# Patient Record
Sex: Female | Born: 1950 | Race: White | Hispanic: No | Smoking: Never smoker
Health system: Southern US, Community
[De-identification: ages and names within clinical notes are randomized; demographics above are authoritative.]

## PROBLEM LIST (undated history)

## (undated) DIAGNOSIS — M1990 Unspecified osteoarthritis, unspecified site: Secondary | ICD-10-CM

## (undated) DIAGNOSIS — K219 Gastro-esophageal reflux disease without esophagitis: Secondary | ICD-10-CM

## (undated) DIAGNOSIS — E785 Hyperlipidemia, unspecified: Secondary | ICD-10-CM

## (undated) DIAGNOSIS — T7840XA Allergy, unspecified, initial encounter: Secondary | ICD-10-CM

## (undated) DIAGNOSIS — F419 Anxiety disorder, unspecified: Secondary | ICD-10-CM

## (undated) DIAGNOSIS — I341 Nonrheumatic mitral (valve) prolapse: Secondary | ICD-10-CM

## (undated) DIAGNOSIS — I10 Essential (primary) hypertension: Secondary | ICD-10-CM

## (undated) DIAGNOSIS — E079 Disorder of thyroid, unspecified: Secondary | ICD-10-CM

## (undated) DIAGNOSIS — M810 Age-related osteoporosis without current pathological fracture: Secondary | ICD-10-CM

## (undated) HISTORY — DX: Allergy, unspecified, initial encounter: T78.40XA

## (undated) HISTORY — DX: Age-related osteoporosis without current pathological fracture: M81.0

## (undated) HISTORY — PX: HERNIA REPAIR: SHX51

## (undated) HISTORY — DX: Gastro-esophageal reflux disease without esophagitis: K21.9

## (undated) HISTORY — DX: Anxiety disorder, unspecified: F41.9

## (undated) HISTORY — DX: Disorder of thyroid, unspecified: E07.9

## (undated) HISTORY — DX: Unspecified osteoarthritis, unspecified site: M19.90

## (undated) HISTORY — PX: KNEE SURGERY: SHX244

## (undated) HISTORY — PX: TONSILLECTOMY: SUR1361

## (undated) HISTORY — PX: ABDOMINAL HYSTERECTOMY: SHX81

## (undated) HISTORY — DX: Hyperlipidemia, unspecified: E78.5

---

## 2003-10-03 ENCOUNTER — Inpatient Hospital Stay (HOSPITAL_COMMUNITY): Admission: AD | Admit: 2003-10-03 | Discharge: 2003-10-03 | Payer: Self-pay

## 2005-01-23 ENCOUNTER — Emergency Department (HOSPITAL_COMMUNITY): Admission: EM | Admit: 2005-01-23 | Discharge: 2005-01-23 | Payer: Self-pay | Admitting: Emergency Medicine

## 2005-03-27 ENCOUNTER — Emergency Department (HOSPITAL_COMMUNITY): Admission: EM | Admit: 2005-03-27 | Discharge: 2005-03-27 | Payer: Self-pay | Admitting: Emergency Medicine

## 2005-08-07 ENCOUNTER — Emergency Department (HOSPITAL_COMMUNITY): Admission: EM | Admit: 2005-08-07 | Discharge: 2005-08-07 | Payer: Self-pay | Admitting: Emergency Medicine

## 2005-10-03 ENCOUNTER — Emergency Department (HOSPITAL_COMMUNITY): Admission: EM | Admit: 2005-10-03 | Discharge: 2005-10-04 | Payer: Self-pay | Admitting: Emergency Medicine

## 2005-10-21 ENCOUNTER — Emergency Department (HOSPITAL_COMMUNITY): Admission: EM | Admit: 2005-10-21 | Discharge: 2005-10-21 | Payer: Self-pay | Admitting: Emergency Medicine

## 2005-11-17 ENCOUNTER — Emergency Department (HOSPITAL_COMMUNITY): Admission: EM | Admit: 2005-11-17 | Discharge: 2005-11-17 | Payer: Self-pay | Admitting: Emergency Medicine

## 2005-12-21 ENCOUNTER — Emergency Department (HOSPITAL_COMMUNITY): Admission: EM | Admit: 2005-12-21 | Discharge: 2005-12-21 | Payer: Self-pay | Admitting: Family Medicine

## 2005-12-23 ENCOUNTER — Emergency Department (HOSPITAL_COMMUNITY): Admission: EM | Admit: 2005-12-23 | Discharge: 2005-12-23 | Payer: Self-pay | Admitting: Emergency Medicine

## 2006-01-19 ENCOUNTER — Emergency Department (HOSPITAL_COMMUNITY): Admission: EM | Admit: 2006-01-19 | Discharge: 2006-01-19 | Payer: Self-pay | Admitting: Emergency Medicine

## 2006-06-30 ENCOUNTER — Emergency Department (HOSPITAL_COMMUNITY): Admission: EM | Admit: 2006-06-30 | Discharge: 2006-06-30 | Payer: Self-pay | Admitting: Emergency Medicine

## 2006-08-26 ENCOUNTER — Emergency Department (HOSPITAL_COMMUNITY): Admission: EM | Admit: 2006-08-26 | Discharge: 2006-08-26 | Payer: Self-pay | Admitting: Emergency Medicine

## 2006-11-13 ENCOUNTER — Emergency Department (HOSPITAL_COMMUNITY): Admission: EM | Admit: 2006-11-13 | Discharge: 2006-11-14 | Payer: Self-pay | Admitting: Emergency Medicine

## 2007-02-05 ENCOUNTER — Emergency Department (HOSPITAL_COMMUNITY): Admission: EM | Admit: 2007-02-05 | Discharge: 2007-02-06 | Payer: Self-pay | Admitting: Emergency Medicine

## 2007-02-17 ENCOUNTER — Emergency Department (HOSPITAL_COMMUNITY): Admission: EM | Admit: 2007-02-17 | Discharge: 2007-02-17 | Payer: Self-pay | Admitting: *Deleted

## 2007-03-28 IMAGING — US US EXTREM LOW VENOUS*L*
1 series · 14 of 23 positions shown · non-contrast
Comparison: none

CLINICAL DATA: Left lower extremity pain and swelling.

LEFT LOWER EXTREMITY VENOUS DUPLEX DOPPLER ULTRASOUND  11/17/2005:
TECHNIQUE: Gray-scale sonography with compression and color and duplex Doppler
ultrasound were performed to evaluate the deep venous system from the level of
the common femoral vein through the popliteal vein.

[Series 1: unknown · 14 of 23 slices shown]
[im 1/23]
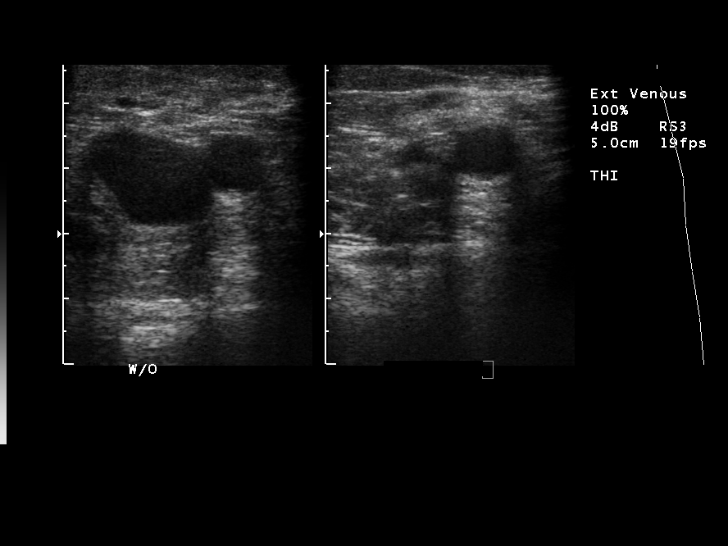
[im 3/23]
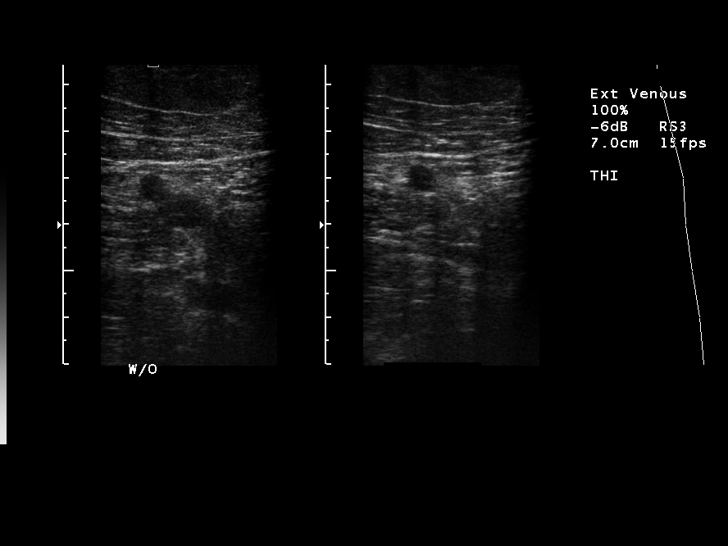
[im 5/23]
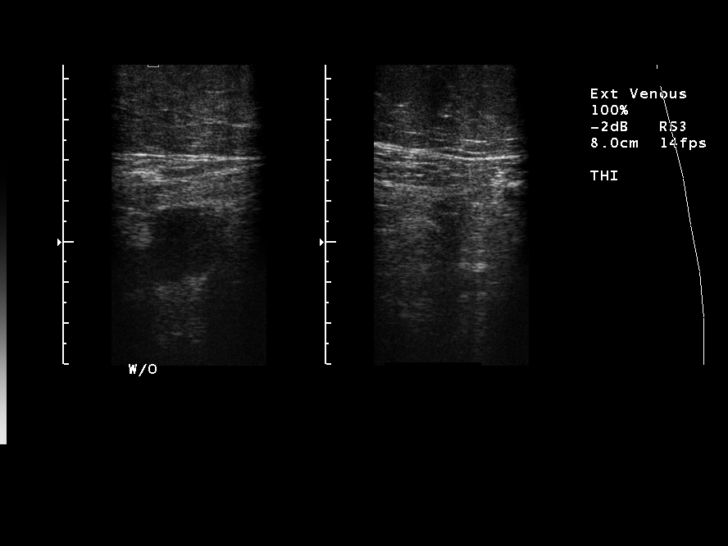
[im 6/23]
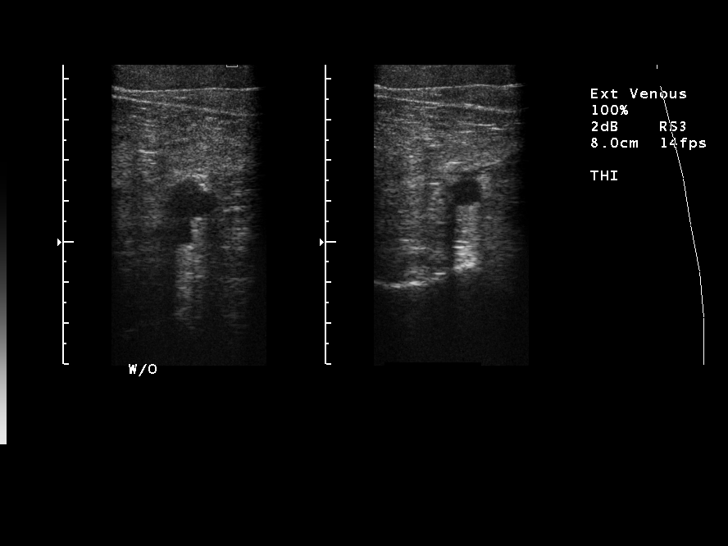
[im 8/23]
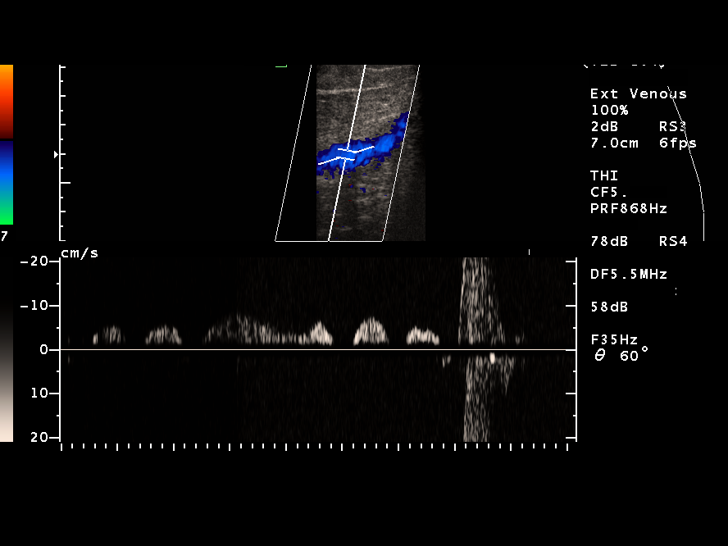
[im 10/23]
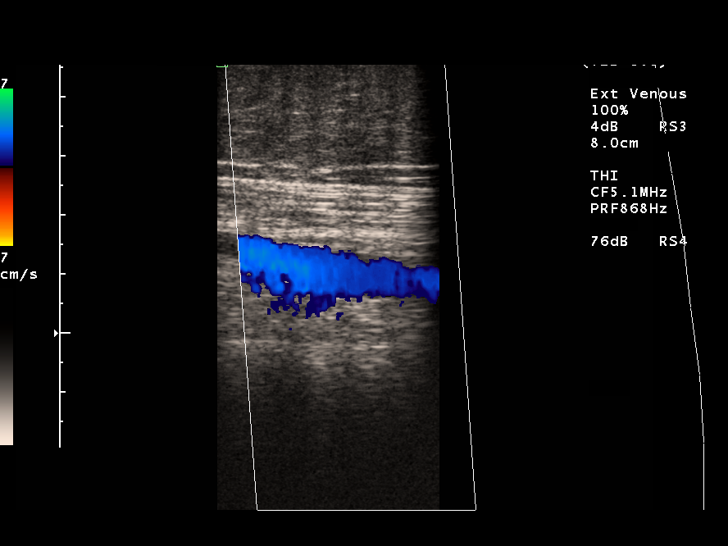
[im 11/23]
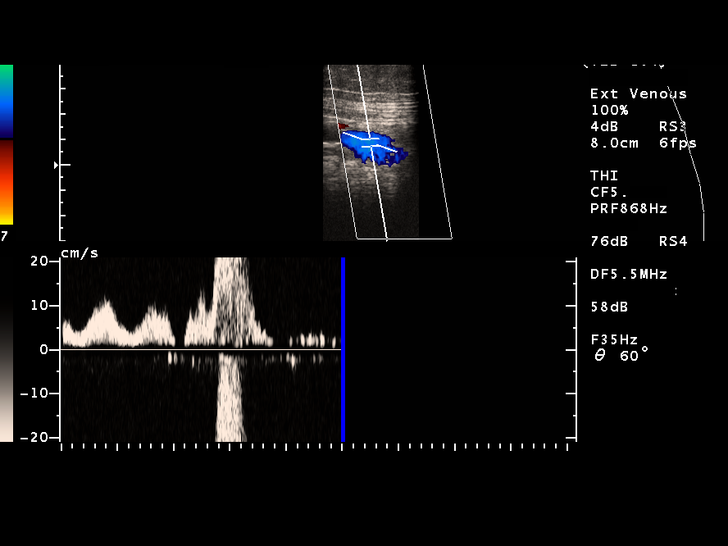
[im 13/23]
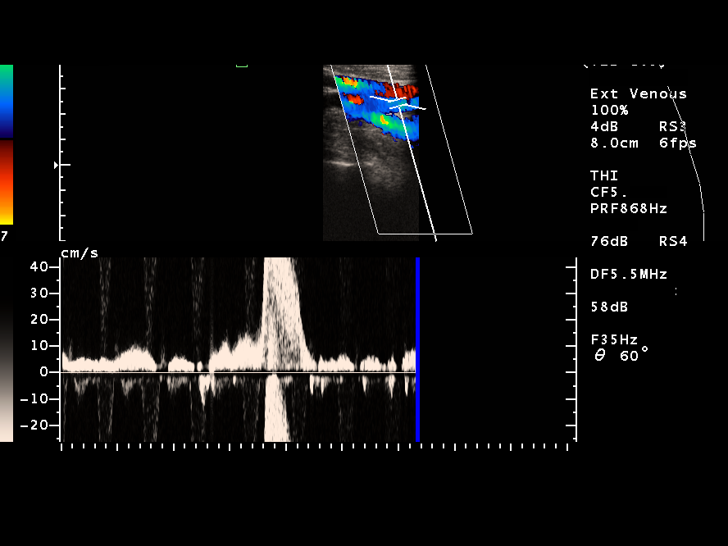
[im 14/23]
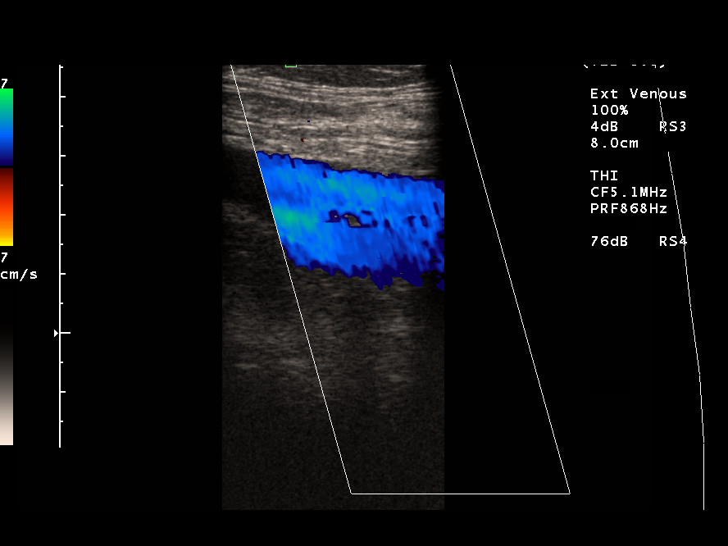
[im 16/23]
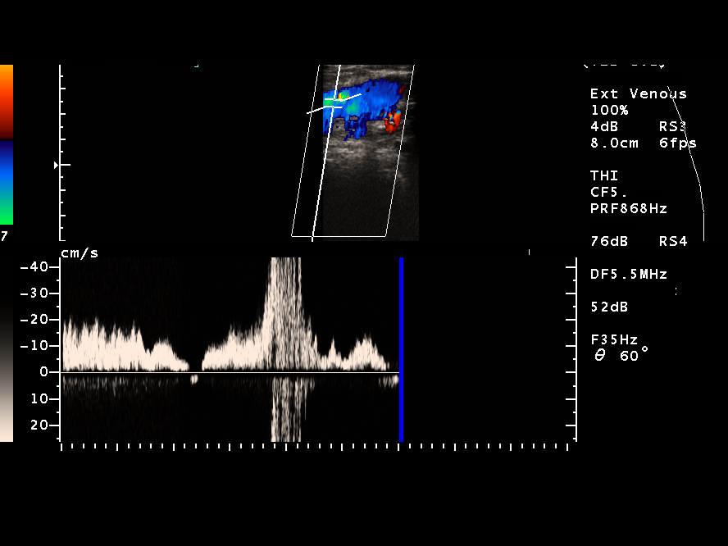
[im 18/23]
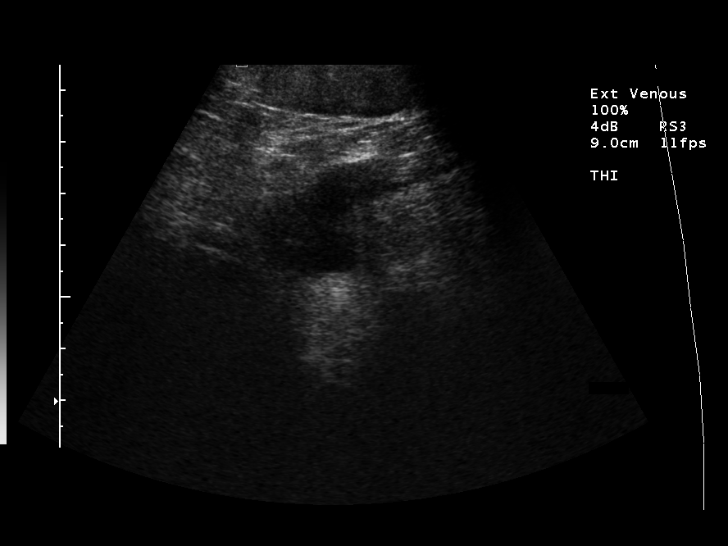
[im 19/23]
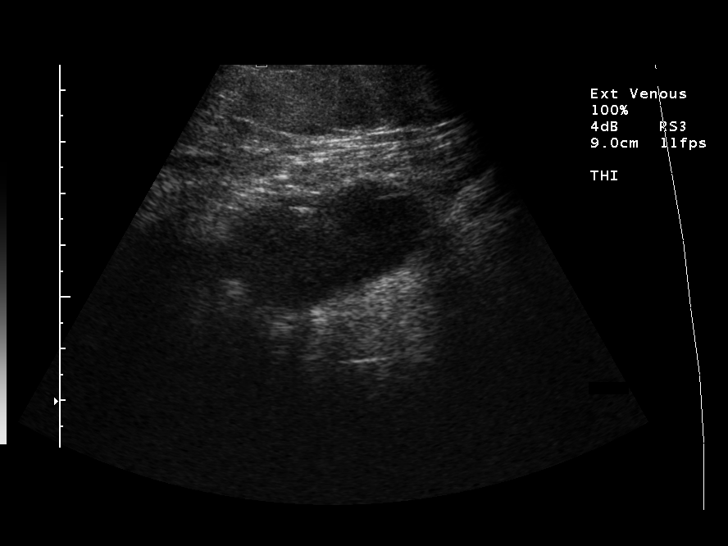
[im 21/23]
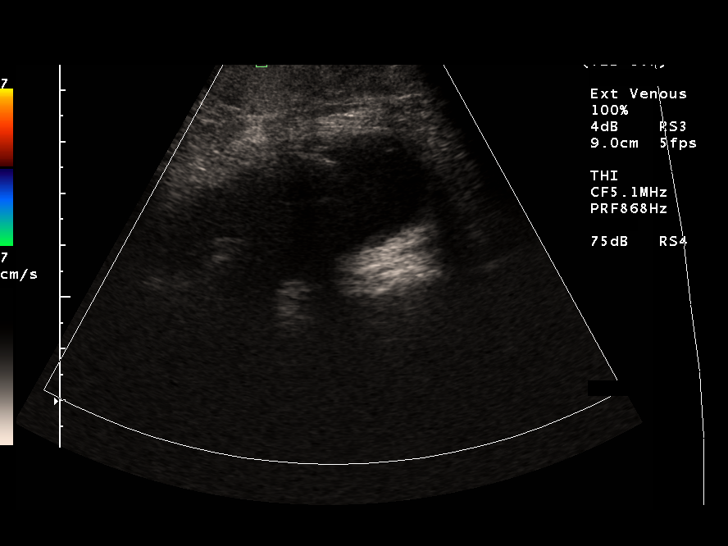
[im 23/23]
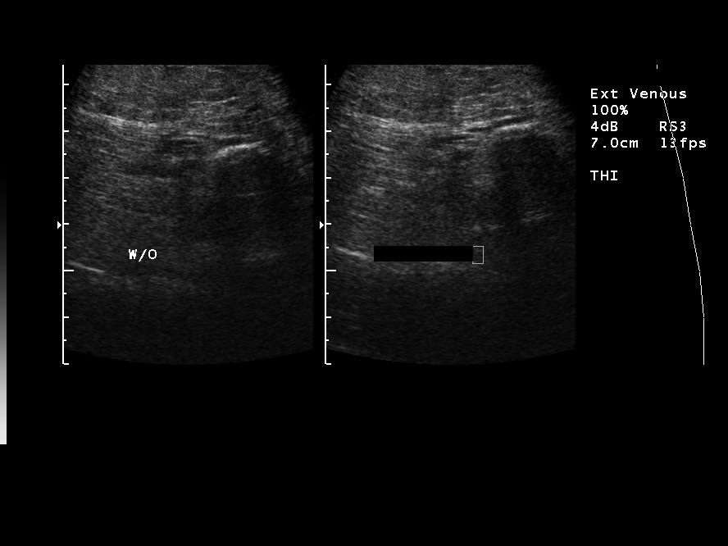

[14 of 23 positions shown; findings below may reference images not displayed]

FINDINGS: The common femoral vein, superficial femoral vein and popliteal vein
all demonstrate normal compressibility. There is normal response to augmentation
in each of these veins. No filling defects are identified to suggest DVT.

Note is made of a fluid collection in the popliteal fossa measuring
approximately 6.2 x 2.2 cm.
IMPRESSION: 1. No evidence of DVT.
2. Baker's cyst in the left popliteal fossa.

## 2007-04-14 ENCOUNTER — Emergency Department (HOSPITAL_COMMUNITY): Admission: EM | Admit: 2007-04-14 | Discharge: 2007-04-14 | Payer: Self-pay | Admitting: Emergency Medicine

## 2007-06-14 ENCOUNTER — Emergency Department (HOSPITAL_COMMUNITY): Admission: EM | Admit: 2007-06-14 | Discharge: 2007-06-14 | Payer: Self-pay | Admitting: Emergency Medicine

## 2007-07-19 ENCOUNTER — Emergency Department (HOSPITAL_COMMUNITY): Admission: EM | Admit: 2007-07-19 | Discharge: 2007-07-19 | Payer: Self-pay | Admitting: Emergency Medicine

## 2007-08-17 ENCOUNTER — Emergency Department (HOSPITAL_COMMUNITY): Admission: EM | Admit: 2007-08-17 | Discharge: 2007-08-17 | Payer: Self-pay | Admitting: Emergency Medicine

## 2007-10-12 ENCOUNTER — Emergency Department (HOSPITAL_COMMUNITY): Admission: EM | Admit: 2007-10-12 | Discharge: 2007-10-13 | Payer: Self-pay | Admitting: Emergency Medicine

## 2007-11-19 ENCOUNTER — Emergency Department (HOSPITAL_COMMUNITY): Admission: EM | Admit: 2007-11-19 | Discharge: 2007-11-19 | Payer: Self-pay | Admitting: Family Medicine

## 2007-12-21 ENCOUNTER — Emergency Department (HOSPITAL_COMMUNITY): Admission: EM | Admit: 2007-12-21 | Discharge: 2007-12-21 | Payer: Self-pay | Admitting: Emergency Medicine

## 2008-01-23 ENCOUNTER — Emergency Department (HOSPITAL_COMMUNITY): Admission: EM | Admit: 2008-01-23 | Discharge: 2008-01-23 | Payer: Self-pay | Admitting: Emergency Medicine

## 2008-02-17 ENCOUNTER — Emergency Department (HOSPITAL_COMMUNITY): Admission: EM | Admit: 2008-02-17 | Discharge: 2008-02-17 | Payer: Self-pay | Admitting: Emergency Medicine

## 2008-04-22 ENCOUNTER — Emergency Department (HOSPITAL_COMMUNITY): Admission: EM | Admit: 2008-04-22 | Discharge: 2008-04-22 | Payer: Self-pay | Admitting: Emergency Medicine

## 2008-06-05 ENCOUNTER — Emergency Department (HOSPITAL_COMMUNITY): Admission: EM | Admit: 2008-06-05 | Discharge: 2008-06-05 | Payer: Self-pay | Admitting: Emergency Medicine

## 2008-07-08 ENCOUNTER — Emergency Department (HOSPITAL_COMMUNITY): Admission: EM | Admit: 2008-07-08 | Discharge: 2008-07-08 | Payer: Self-pay | Admitting: Emergency Medicine

## 2008-08-09 ENCOUNTER — Emergency Department (HOSPITAL_COMMUNITY): Admission: EM | Admit: 2008-08-09 | Discharge: 2008-08-09 | Payer: Self-pay | Admitting: Family Medicine

## 2008-11-22 ENCOUNTER — Emergency Department (HOSPITAL_COMMUNITY): Admission: EM | Admit: 2008-11-22 | Discharge: 2008-11-22 | Payer: Self-pay | Admitting: Family Medicine

## 2009-02-28 ENCOUNTER — Emergency Department (HOSPITAL_COMMUNITY): Admission: EM | Admit: 2009-02-28 | Discharge: 2009-03-01 | Payer: Self-pay | Admitting: Emergency Medicine

## 2009-04-20 ENCOUNTER — Emergency Department (HOSPITAL_COMMUNITY): Admission: EM | Admit: 2009-04-20 | Discharge: 2009-04-21 | Payer: Self-pay | Admitting: Emergency Medicine

## 2009-07-16 ENCOUNTER — Emergency Department (HOSPITAL_COMMUNITY): Admission: EM | Admit: 2009-07-16 | Discharge: 2009-07-16 | Payer: Self-pay | Admitting: Family Medicine

## 2009-07-16 ENCOUNTER — Ambulatory Visit: Payer: Self-pay | Admitting: Cardiology

## 2009-07-16 ENCOUNTER — Observation Stay (HOSPITAL_COMMUNITY): Admission: EM | Admit: 2009-07-16 | Discharge: 2009-07-17 | Payer: Self-pay | Admitting: Emergency Medicine

## 2009-07-21 ENCOUNTER — Telehealth (INDEPENDENT_AMBULATORY_CARE_PROVIDER_SITE_OTHER): Payer: Self-pay

## 2009-07-25 ENCOUNTER — Encounter: Payer: Self-pay | Admitting: Cardiology

## 2009-07-25 ENCOUNTER — Ambulatory Visit: Payer: Self-pay | Admitting: Cardiology

## 2009-07-25 ENCOUNTER — Encounter (HOSPITAL_COMMUNITY): Admission: RE | Admit: 2009-07-25 | Discharge: 2009-10-05 | Payer: Self-pay | Admitting: Cardiology

## 2009-07-25 ENCOUNTER — Ambulatory Visit: Payer: Self-pay

## 2009-08-12 DIAGNOSIS — E119 Type 2 diabetes mellitus without complications: Secondary | ICD-10-CM

## 2009-08-12 DIAGNOSIS — I059 Rheumatic mitral valve disease, unspecified: Secondary | ICD-10-CM | POA: Insufficient documentation

## 2009-08-12 DIAGNOSIS — E039 Hypothyroidism, unspecified: Secondary | ICD-10-CM | POA: Insufficient documentation

## 2009-08-12 DIAGNOSIS — I10 Essential (primary) hypertension: Secondary | ICD-10-CM | POA: Insufficient documentation

## 2009-08-16 ENCOUNTER — Ambulatory Visit: Payer: Self-pay | Admitting: Cardiology

## 2009-08-16 DIAGNOSIS — R42 Dizziness and giddiness: Secondary | ICD-10-CM

## 2009-08-16 DIAGNOSIS — R079 Chest pain, unspecified: Secondary | ICD-10-CM | POA: Insufficient documentation

## 2009-10-06 ENCOUNTER — Emergency Department (HOSPITAL_COMMUNITY): Admission: EM | Admit: 2009-10-06 | Discharge: 2009-10-06 | Payer: Self-pay | Admitting: Emergency Medicine

## 2009-12-10 ENCOUNTER — Emergency Department (HOSPITAL_COMMUNITY): Admission: EM | Admit: 2009-12-10 | Discharge: 2009-12-10 | Payer: Self-pay | Admitting: Emergency Medicine

## 2010-03-01 NOTE — Progress Notes (Signed)
Summary: Nuc. Pre-Procedure  Phone Note Outgoing Call Call back at [facility] Phone [phone]   Call placed by: Irean Hong, RN,  July 21, 2009 3:07 PM Summary of Call: Left message with information on Myoview Information Sheet (see scanned document for details).      Nuclear Med Background Indications for Stress Test: Evaluation for Ischemia, Post Hospital  Indications Comments: Discharged 07/17/09 CP, (-) enzymes.  History: Heart Catheterization, Myocardial Perfusion Study, MVP  History Comments: 4 yrs ago Cath: at [place], Negative per patient. 3 yrs. ago MPS: negative per patient.  Symptoms: Chest Pain, Chest Pain with Exertion    Nuclear Pre-Procedure Cardiac Risk Factors: History of Smoking, Hypertension, IDDM Type 2, Lipids

## 2010-03-01 NOTE — Assessment & Plan Note (Signed)
Summary: Cardiology Nuclear Study  Nuclear Med Background Indications for Stress Test: Evaluation for Ischemia, Post Hospital  Indications Comments: Discharged 07/17/09 CP, (-) enzymes.  History: Heart Catheterization, Myocardial Perfusion Study, MVP  History Comments: 4 yrs ago Cath: at [place], Negative per patient. 3 yrs. ago MPS: negative per patient.  Symptoms: Chest Pain, Chest Pain with Exertion, DOE, SOB    Nuclear Pre-Procedure Cardiac Risk Factors: History of Smoking, Hypertension, IDDM Type 2, Lipids Caffeine/Decaff Intake: None NPO After: 12:00 AM Lungs: clear IV 0.9% NS with Angio Cath: 22g     IV Site: (R) wrist area IV Started by: Stanton Kidney EMT-P Chest Size (in) 38     Cup Size B     Height (in): 68 Weight (lb): 218 BMI: 33.27 Tech Comments: CBG= 184 @ 9:15 am today, per Patient.  Nuclear Med Study 1 or 2 day study:  1 day     Stress Test Type:  Eugenie Birks Reading MD:  Marca Ancona, MD     Referring MD:  J.Hochrein Resting Radionuclide:  Technetium 56m Tetrofosmin     Resting Radionuclide Dose:  11 mCi  Stress Radionuclide:  Technetium 11m Tetrofosmin     Stress Radionuclide Dose:  33 mCi   Stress Protocol   Lexiscan: 0.4 mg   Stress Test Technologist:  Milana Na EMT-P     Nuclear Technologist:  Domenic Polite CNMT  Rest Procedure  Myocardial perfusion imaging was performed at rest 45 minutes following the intravenous administration of Myoview Technetium 91m Tetrofosmin.  Stress Procedure  The patient received IV Lexiscan 0.4 mg over 15-seconds.  Myoview injected at 30-seconds.  There were no significant changes with infusion.  Quantitative spect images were obtained after a 45 minute delay.  QPS Raw Data Images:  Normal; no motion artifact; normal heart/lung ratio. Stress Images:  NI: Uniform and normal uptake of tracer in all myocardial segments. Rest Images:  Normal homogeneous uptake in all areas of the myocardium. Subtraction (SDS):  There is  no evidence of scar or ischemia. Transient Ischemic Dilatation:  1.01  (Normal <1.22)  Lung/Heart Ratio:  .29  (Normal <0.45)  Quantitative Gated Spect Images QGS EDV:  79 ml QGS ESV:  26 ml QGS EF:  67 % QGS cine images:  Normal wall motion   Overall Impression  Exercise Capacity: Lexiscan study with no exercise.  BP Response: Hypotensive blood pressure response. Clinical Symptoms: Palpitations ECG Impression: No significant ST segment change suggestive of ischemia. Overall Impression: Normal stress nuclear study.  Appended Document: Cardiology Nuclear Study No evidence of ischemia or infarct.  No further cardiac work up.  Appended Document: Cardiology Nuclear Study Called patient and left message on machine of results and to call back with any questions

## 2010-03-01 NOTE — Assessment & Plan Note (Signed)
Summary: eph/post myoview/lg   Visit Type:  Follow-up Primary Provider:  [facility]  CC:  chest pain.  History of Present Illness: The patient presents for evaluation of chest discomfort. She was hospitalized late last month. She ruled out for microinfarction. Stress perfusion study demonstrated no evidence of ischemia or infarct. Since that time she has done well with no further chest discomfort. She has had some dizziness and reports that her blood pressure has been running low. She has not been describing palpitations, presyncope or syncope. She has had no new shortness of breath, PND or orthopnea. She is under stress because her mother was just diagnosed with heart failure.  Current Medications (verified): 1)  Verapamil Hcl Cr 180 Mg Xr24h-Cap (Verapamil Hcl) .... Take One Capsule By Two Times A Day 2)  Synthroid 75 Mcg Tabs (Levothyroxine Sodium) .... Take 1 Tablet By Mouth Once A Day 3)  Hydrochlorothiazide 25 Mg Tabs (Hydrochlorothiazide) .... Take One Tablet By Mouth Daily. 4)  Vitamin D3 1000 Unit Tabs (Cholecalciferol) .... Take 1 Tablet By Mouth Once A Day 5)  Metformin Hcl 1000 Mg Tabs (Metformin Hcl) .... Take 1 Tablet By Mouth Two Times A Day 6)  Novolin 70/30 70-30 % Susp (Insulin Isophane & Regular) .... 42 Units Am and 22 Units Pm 7)  Crestor 20 Mg Tabs (Rosuvastatin Calcium) .... Take 1/2  Tablet By Mouth Daily. 8)  Nitrostat 0.4 Mg Subl (Nitroglycerin) .Marland Kitchen.. 1 Tablet Under Tongue At Onset of Chest Pain; You May Repeat Every 5 Minutes For Up To 3 Doses. 9)  Aspirin Ec 325 Mg Tbec (Aspirin) .... Take One Tablet By Mouth Daily 10)  Fish Oil 1000 Mg Caps (Omega-3 Fatty Acids) .... 2 Capsules Two Times A Day 11)  Hydrocodone-Acetaminophen 5-500 Mg Tabs (Hydrocodone-Acetaminophen) .... As Needed  Allergies: 1)  ! Thorazine 2)  ! Compazine 3)  ! * Vistaril 4)  ! Darvocet 5)  ! Percocet 6)  ! Codeine  Past History:  Past Medical History: Reviewed history from 08/12/2009  and no changes required.  Diabetes mellitus x6 years, hypertension x4   years, panic attacks, anxiety, mitral valve prolapse, hypothyroidism,   hepatitis (she cannot give me any details on this but says she did not   have any antibodies when last tested), seizures.      Past Surgical History: Reviewed history from 08/12/2009 and no changes required.  Left arm surgery after falling down the stairs   with a seizure, hysterectomy, cholecystectomy, benign skin lesion   resected.   Review of Systems       As stated in the HPI and negative for all other systems.   Vital Signs:  Patient profile:   60 year old female Height:      68 inches Weight:      221.50 pounds BMI:     33.80 Pulse rate:   88 / minute Pulse (ortho):   93 / minute Pulse rhythm:   regular Resp:     18 per minute BP sitting:   124 / 70  (right arm) BP standing:   126 / 77 Cuff size:   large  Vitals Entered By: Vikki Ports (August 16, 2009 2:50 PM)  Serial Vital Signs/Assessments:  Time      Position  BP       Pulse  Resp  Temp     By           Lying RA  123/80   81  Vikki Ports           Sitting   119/78   83                    Luz Jaramillo 4:15 PM   Standing  126/77   93                    Luz Jaramillo 4:17 PM   Standing  125/84   94                    Luz Jaramillo 4:20 PM   Standing  130/81   93                    Vikki Ports   Physical Exam  General:  Well developed, well nourished, in no acute distress. Head:  normocephalic and atraumatic Eyes:  PERRLA/EOM intact; conjunctiva and lids normal. Mouth:  Edentulous. Oral mucosa normal. Neck:  Neck supple, no JVD. No masses, thyromegaly or abnormal cervical nodes. Chest Wall:  no deformities or breast masses noted Lungs:  Clear bilaterally to auscultation and percussion. Abdomen:  Bowel sounds positive; abdomen soft and non-tender without masses, organomegaly, or hernias noted. No hepatosplenomegaly. Msk:  Back normal, normal  gait. Muscle strength and tone normal. Extremities:  No clubbing or cyanosis. Neurologic:  Alert and oriented x 3. Skin:  Intact without lesions or rashes. Psych:  Normal affect.   Detailed Cardiovascular Exam  Neck    Carotids: Carotids full and equal bilaterally without bruits.      Neck Veins: Normal, no JVD.    Heart    Inspection: no deformities or lifts noted.      Palpation: normal PMI with no thrills palpable.      Auscultation: regular rate and rhythm, S1, S2 without murmurs, rubs, gallops, or clicks.    Vascular    Abdominal Aorta: no palpable masses, pulsations, or audible bruits.      Femoral Pulses: normal femoral pulses bilaterally.      Pedal Pulses: normal pedal pulses bilaterally.      Radial Pulses: normal radial pulses bilaterally.      Peripheral Circulation: no clubbing, cyanosis, or edema noted with normal capillary refill.     Impression & Recommendations:  Problem # 1:  CHEST PAIN (ICD-786.50) The etiology of  her previous chest pain was not clear. There does not appear to be an anginal equivalent. No further cardiovascular testing is suggested.  Problem # 2:  HYPERTENSION (ICD-401.9) She reports some recent low blood pressures though it is normal today. She was not orthostatic. If her pressures continue to be low she needs to correlate her blood pressure cuff with her primary physician and can have her meds adjusted downward.  Problem # 3:  MITRAL VALVE PROLAPSE (ICD-424.0) She has no murmur or regurgitation. No further imaging is indicated at this point.  Patient Instructions: 1)  Your physician recommends that you schedule a follow-up appointment as needed 2)  Your physician recommends that you continue on your current medications as directed. Please refer to the Current Medication list given to you today. Prescriptions: VERAPAMIL HCL CR 180 MG XR24H-CAP (VERAPAMIL HCL) Take one capsule by two times a day  #20 x 0   Entered by:   Charolotte Capuchin, RN   Authorized by:   Rollene Rotunda, MD, [facility]   Signed by:   Charolotte Capuchin, RN on 08/16/2009   Method used:   Electronically  to        [facility] [phone]* (retail)       [address]       [place], [place]  [address]       Ph: [phone]       Fax: [phone]   RxID:   [id number]  I have reviewed and approved all prescriptions at the time of this visit. Rollene Rotunda, MD, [facility]  August 17, 2009 5:43 PM

## 2010-03-03 ENCOUNTER — Emergency Department (HOSPITAL_COMMUNITY)
Admission: EM | Admit: 2010-03-03 | Discharge: 2010-03-04 | Disposition: A | Discharge status: Home / Self Care | Payer: Medicare Other | Attending: Emergency Medicine | Admitting: Emergency Medicine

## 2010-03-03 DIAGNOSIS — E669 Obesity, unspecified: Secondary | ICD-10-CM | POA: Insufficient documentation

## 2010-03-03 DIAGNOSIS — M25559 Pain in unspecified hip: Secondary | ICD-10-CM | POA: Insufficient documentation

## 2010-03-03 DIAGNOSIS — I10 Essential (primary) hypertension: Secondary | ICD-10-CM | POA: Insufficient documentation

## 2010-03-03 DIAGNOSIS — E039 Hypothyroidism, unspecified: Secondary | ICD-10-CM | POA: Insufficient documentation

## 2010-03-03 DIAGNOSIS — E119 Type 2 diabetes mellitus without complications: Secondary | ICD-10-CM | POA: Insufficient documentation

## 2010-03-03 DIAGNOSIS — I059 Rheumatic mitral valve disease, unspecified: Secondary | ICD-10-CM | POA: Insufficient documentation

## 2010-03-03 DIAGNOSIS — M545 Low back pain, unspecified: Secondary | ICD-10-CM | POA: Insufficient documentation

## 2010-03-03 DIAGNOSIS — E7800 Pure hypercholesterolemia, unspecified: Secondary | ICD-10-CM | POA: Insufficient documentation

## 2010-03-04 ENCOUNTER — Emergency Department (HOSPITAL_COMMUNITY): Payer: Medicare Other

## 2010-03-04 ENCOUNTER — Emergency Department (HOSPITAL_COMMUNITY): Admit: 2010-03-04 | Payer: Medicare Other

## 2010-03-18 ENCOUNTER — Emergency Department (HOSPITAL_COMMUNITY): Payer: Medicare Other

## 2010-03-18 ENCOUNTER — Emergency Department (HOSPITAL_COMMUNITY)
Admission: EM | Admit: 2010-03-18 | Discharge: 2010-03-18 | Disposition: A | Discharge status: Home / Self Care | Payer: Medicare Other | Attending: Emergency Medicine | Admitting: Emergency Medicine

## 2010-03-18 DIAGNOSIS — J3489 Other specified disorders of nose and nasal sinuses: Secondary | ICD-10-CM | POA: Insufficient documentation

## 2010-03-18 DIAGNOSIS — R350 Frequency of micturition: Secondary | ICD-10-CM | POA: Insufficient documentation

## 2010-03-18 DIAGNOSIS — E7800 Pure hypercholesterolemia, unspecified: Secondary | ICD-10-CM | POA: Insufficient documentation

## 2010-03-18 DIAGNOSIS — R3915 Urgency of urination: Secondary | ICD-10-CM | POA: Insufficient documentation

## 2010-03-18 DIAGNOSIS — E119 Type 2 diabetes mellitus without complications: Secondary | ICD-10-CM | POA: Insufficient documentation

## 2010-03-18 DIAGNOSIS — R300 Dysuria: Secondary | ICD-10-CM | POA: Insufficient documentation

## 2010-03-18 DIAGNOSIS — R197 Diarrhea, unspecified: Secondary | ICD-10-CM | POA: Insufficient documentation

## 2010-03-18 DIAGNOSIS — I10 Essential (primary) hypertension: Secondary | ICD-10-CM | POA: Insufficient documentation

## 2010-03-18 DIAGNOSIS — Z794 Long term (current) use of insulin: Secondary | ICD-10-CM | POA: Insufficient documentation

## 2010-03-18 DIAGNOSIS — I059 Rheumatic mitral valve disease, unspecified: Secondary | ICD-10-CM | POA: Insufficient documentation

## 2010-03-18 DIAGNOSIS — E669 Obesity, unspecified: Secondary | ICD-10-CM | POA: Insufficient documentation

## 2010-03-18 DIAGNOSIS — R509 Fever, unspecified: Secondary | ICD-10-CM | POA: Insufficient documentation

## 2010-03-18 DIAGNOSIS — N390 Urinary tract infection, site not specified: Secondary | ICD-10-CM | POA: Insufficient documentation

## 2010-03-18 DIAGNOSIS — R1110 Vomiting, unspecified: Secondary | ICD-10-CM | POA: Insufficient documentation

## 2010-03-18 DIAGNOSIS — R05 Cough: Secondary | ICD-10-CM | POA: Insufficient documentation

## 2010-03-18 DIAGNOSIS — J069 Acute upper respiratory infection, unspecified: Secondary | ICD-10-CM | POA: Insufficient documentation

## 2010-03-18 DIAGNOSIS — E039 Hypothyroidism, unspecified: Secondary | ICD-10-CM | POA: Insufficient documentation

## 2010-03-18 DIAGNOSIS — R319 Hematuria, unspecified: Secondary | ICD-10-CM | POA: Insufficient documentation

## 2010-03-18 DIAGNOSIS — R059 Cough, unspecified: Secondary | ICD-10-CM | POA: Insufficient documentation

## 2010-03-18 LAB — GLUCOSE, CAPILLARY

## 2010-03-18 LAB — URINALYSIS, ROUTINE W REFLEX MICROSCOPIC
Bilirubin Urine: NEGATIVE
Protein, ur: >300 mg/dL — AB
Urobilinogen, UA: 0.2 mg/dL (ref 0.0–1.0)

## 2010-04-13 LAB — POCT CARDIAC MARKERS
CKMB, poc: 1.2 ng/mL (ref 1.0–8.0)
Myoglobin, poc: 57.9 ng/mL (ref 12–200)
Troponin i, poc: <0.05 ng/mL (ref 0.00–0.09)

## 2010-04-16 LAB — URINALYSIS, ROUTINE W REFLEX MICROSCOPIC
Bilirubin Urine: NEGATIVE
Ketones, ur: NEGATIVE mg/dL
Leukocytes, UA: NEGATIVE
Nitrite: NEGATIVE
Protein, ur: NEGATIVE mg/dL
Urobilinogen, UA: 0.2 mg/dL (ref 0.0–1.0)
pH: 5 (ref 5.0–8.0)

## 2010-04-16 LAB — DIFFERENTIAL
Basophils Absolute: 0 10*3/uL (ref 0.0–0.1)
Basophils Relative: 0 % (ref 0–1)
Basophils Relative: 1 % (ref 0–1)
Eosinophils Absolute: 0 10*3/uL (ref 0.0–0.7)
Eosinophils Absolute: 0.1 10*3/uL (ref 0.0–0.7)
Eosinophils Relative: 0 % (ref 0–5)
Eosinophils Relative: 2 % (ref 0–5)
Lymphs Abs: 1.6 10*3/uL (ref 0.7–4.0)
Monocytes Absolute: 0.8 10*3/uL (ref 0.1–1.0)
Neutrophils Relative %: 58 % (ref 43–77)

## 2010-04-16 LAB — CK TOTAL AND CKMB (NOT AT ARMC)
CK, MB: 1.9 ng/mL (ref 0.3–4.0)
Relative Index: 0.4 (ref 0.0–2.5)
Total CK: 499 U/L — ABNORMAL HIGH (ref 7–177)

## 2010-04-16 LAB — CBC
HCT: 37.9 % (ref 36.0–46.0)
HCT: 37.4 % (ref 36.0–46.0)
Hemoglobin: 12.5 g/dL (ref 12.0–15.0)
MCHC: 34.1 g/dL (ref 30.0–36.0)
MCHC: 34.7 g/dL (ref 30.0–36.0)
MCHC: 34.9 g/dL (ref 30.0–36.0)
MCV: 90.6 fL (ref 78.0–100.0)
MCV: 90.7 fL (ref 78.0–100.0)
Platelets: 171 10*3/uL (ref 150–400)
Platelets: 191 10*3/uL (ref 150–400)
RDW: 13.1 % (ref 11.5–15.5)
WBC: 8 10*3/uL (ref 4.0–10.5)

## 2010-04-16 LAB — BASIC METABOLIC PANEL
BUN: 12 mg/dL (ref 6–23)
CO2: 27 mEq/L (ref 19–32)
Calcium: 8.3 mg/dL — ABNORMAL LOW (ref 8.4–10.5)
Calcium: 9.3 mg/dL (ref 8.4–10.5)
Chloride: 102 mEq/L (ref 96–112)
Creatinine, Ser: 0.62 mg/dL (ref 0.4–1.2)
Creatinine, Ser: 0.65 mg/dL (ref 0.4–1.2)
GFR calc Af Amer: >60 mL/min (ref >60)
GFR calc Af Amer: >60 mL/min (ref >60)
GFR calc non Af Amer: >60 mL/min (ref >60)
GFR calc non Af Amer: >60 mL/min (ref >60)
Glucose, Bld: 103 mg/dL — ABNORMAL HIGH (ref 70–99)
Sodium: 137 mEq/L (ref 135–145)

## 2010-04-16 LAB — HEMOGLOBIN A1C
Hgb A1c MFr Bld: 7.6 % — ABNORMAL HIGH (ref <5.7)
Mean Plasma Glucose: 171 mg/dL — ABNORMAL HIGH (ref <117)

## 2010-04-16 LAB — POCT I-STAT, CHEM 8
Creatinine, Ser: 0.6 mg/dL (ref 0.4–1.2)
Glucose, Bld: 130 mg/dL — ABNORMAL HIGH (ref 70–99)
HCT: 38 % (ref 36.0–46.0)
Hemoglobin: 12.9 g/dL (ref 12.0–15.0)
Potassium: 3.8 mEq/L (ref 3.5–5.1)
Sodium: 135 mEq/L (ref 135–145)
TCO2: 25 mmol/L (ref 0–100)

## 2010-04-16 LAB — GLUCOSE, CAPILLARY
Glucose-Capillary: 190 mg/dL — ABNORMAL HIGH (ref 70–99)
Glucose-Capillary: 194 mg/dL — ABNORMAL HIGH (ref 70–99)

## 2010-04-16 LAB — CARDIAC PANEL(CRET KIN+CKTOT+MB+TROPI)
Relative Index: 0.4 (ref 0.0–2.5)
Troponin I: 0.01 ng/mL (ref 0.00–0.06)

## 2010-04-16 LAB — LIPID PANEL
HDL: 37 mg/dL — ABNORMAL LOW (ref >39)
Total CHOL/HDL Ratio: 3.5 RATIO

## 2010-04-16 LAB — TSH: TSH: 0.729 u[IU]/mL (ref 0.350–4.500)

## 2010-04-16 LAB — TROPONIN I: Troponin I: 0.01 ng/mL (ref 0.00–0.06)

## 2010-05-07 LAB — POCT I-STAT, CHEM 8
Calcium, Ion: 1.22 mmol/L (ref 1.12–1.32)
Glucose, Bld: 125 mg/dL — ABNORMAL HIGH (ref 70–99)
HCT: 42 % (ref 36.0–46.0)
Hemoglobin: 14.3 g/dL (ref 12.0–15.0)
TCO2: 28 mmol/L (ref 0–100)

## 2010-05-07 LAB — DIFFERENTIAL
Basophils Absolute: 0.1 10*3/uL (ref 0.0–0.1)
Basophils Relative: 1 % (ref 0–1)
Neutro Abs: 4.4 10*3/uL (ref 1.7–7.7)
Neutrophils Relative %: 52 % (ref 43–77)

## 2010-05-07 LAB — CBC
MCHC: 35 g/dL (ref 30.0–36.0)
RBC: 4.42 MIL/uL (ref 3.87–5.11)
RDW: 13.1 % (ref 11.5–15.5)

## 2010-05-11 LAB — DIFFERENTIAL
Eosinophils Absolute: 0.1 10*3/uL (ref 0.0–0.7)
Lymphs Abs: 1.8 10*3/uL (ref 0.7–4.0)
Neutrophils Relative %: 62 % (ref 43–77)

## 2010-05-11 LAB — CBC
MCV: 91.5 fL (ref 78.0–100.0)
Platelets: 181 10*3/uL (ref 150–400)
WBC: 7.6 10*3/uL (ref 4.0–10.5)

## 2010-05-11 LAB — BASIC METABOLIC PANEL
BUN: 12 mg/dL (ref 6–23)
Chloride: 100 mEq/L (ref 96–112)
Creatinine, Ser: 1.2 mg/dL (ref 0.4–1.2)
GFR calc non Af Amer: 46 mL/min — ABNORMAL LOW (ref >60)

## 2010-05-15 LAB — POCT CARDIAC MARKERS
CKMB, poc: <1 ng/mL — ABNORMAL LOW (ref 1.0–8.0)
Troponin i, poc: <0.05 ng/mL (ref 0.00–0.09)

## 2010-05-15 LAB — POCT I-STAT, CHEM 8
BUN: 17 mg/dL (ref 6–23)
Chloride: 103 mEq/L (ref 96–112)
Creatinine, Ser: 0.8 mg/dL (ref 0.4–1.2)
Glucose, Bld: 88 mg/dL (ref 70–99)
Potassium: 3.9 mEq/L (ref 3.5–5.1)

## 2010-05-15 LAB — CBC
MCV: 90.5 fL (ref 78.0–100.0)
RBC: 4.67 MIL/uL (ref 3.87–5.11)
WBC: 10.2 10*3/uL (ref 4.0–10.5)

## 2010-05-15 LAB — DIFFERENTIAL
Eosinophils Absolute: 0.1 10*3/uL (ref 0.0–0.7)
Lymphocytes Relative: 35 % (ref 12–46)
Lymphs Abs: 3.6 10*3/uL (ref 0.7–4.0)
Monocytes Relative: 9 % (ref 3–12)
Neutro Abs: 5.5 10*3/uL (ref 1.7–7.7)
Neutrophils Relative %: 54 % (ref 43–77)

## 2010-05-24 ENCOUNTER — Ambulatory Visit (INDEPENDENT_AMBULATORY_CARE_PROVIDER_SITE_OTHER): Payer: Medicare Other

## 2010-05-24 ENCOUNTER — Inpatient Hospital Stay (INDEPENDENT_AMBULATORY_CARE_PROVIDER_SITE_OTHER)
Admission: RE | Admit: 2010-05-24 | Discharge: 2010-05-24 | Disposition: A | Discharge status: Home / Self Care | Payer: Medicare Other | Source: Ambulatory Visit | Attending: Family Medicine | Admitting: Family Medicine

## 2010-05-24 DIAGNOSIS — M79609 Pain in unspecified limb: Secondary | ICD-10-CM

## 2010-06-20 ENCOUNTER — Emergency Department (HOSPITAL_COMMUNITY)
Admission: EM | Admit: 2010-06-20 | Discharge: 2010-06-20 | Disposition: A | Discharge status: Home / Self Care | Payer: Medicare Other | Attending: Emergency Medicine | Admitting: Emergency Medicine

## 2010-06-20 DIAGNOSIS — E669 Obesity, unspecified: Secondary | ICD-10-CM | POA: Insufficient documentation

## 2010-06-20 DIAGNOSIS — M7989 Other specified soft tissue disorders: Secondary | ICD-10-CM | POA: Insufficient documentation

## 2010-06-20 DIAGNOSIS — M25569 Pain in unspecified knee: Secondary | ICD-10-CM | POA: Insufficient documentation

## 2010-06-20 DIAGNOSIS — Z794 Long term (current) use of insulin: Secondary | ICD-10-CM | POA: Insufficient documentation

## 2010-06-20 DIAGNOSIS — I059 Rheumatic mitral valve disease, unspecified: Secondary | ICD-10-CM | POA: Insufficient documentation

## 2010-06-20 DIAGNOSIS — E039 Hypothyroidism, unspecified: Secondary | ICD-10-CM | POA: Insufficient documentation

## 2010-06-20 DIAGNOSIS — E7800 Pure hypercholesterolemia, unspecified: Secondary | ICD-10-CM | POA: Insufficient documentation

## 2010-06-20 DIAGNOSIS — E119 Type 2 diabetes mellitus without complications: Secondary | ICD-10-CM | POA: Insufficient documentation

## 2010-06-20 DIAGNOSIS — I10 Essential (primary) hypertension: Secondary | ICD-10-CM | POA: Insufficient documentation

## 2010-10-18 ENCOUNTER — Inpatient Hospital Stay (INDEPENDENT_AMBULATORY_CARE_PROVIDER_SITE_OTHER)
Admission: RE | Admit: 2010-10-18 | Discharge: 2010-10-18 | Disposition: A | Discharge status: Home / Self Care | Payer: Medicare Other | Source: Ambulatory Visit | Attending: Family Medicine | Admitting: Family Medicine

## 2010-10-18 DIAGNOSIS — M799 Soft tissue disorder, unspecified: Secondary | ICD-10-CM

## 2010-10-18 LAB — POCT URINALYSIS DIP (DEVICE)
Bilirubin Urine: NEGATIVE
Ketones, ur: NEGATIVE mg/dL
Nitrite: NEGATIVE

## 2010-10-19 LAB — URINALYSIS, ROUTINE W REFLEX MICROSCOPIC
Ketones, ur: NEGATIVE
Nitrite: NEGATIVE
Specific Gravity, Urine: 1.008
Urobilinogen, UA: 0.2
pH: 5.5

## 2010-11-01 LAB — BASIC METABOLIC PANEL
BUN: 18
Calcium: 9
GFR calc non Af Amer: >60
Potassium: 4.1
Sodium: 137

## 2010-11-01 LAB — CBC
HCT: 36.4
Platelets: 228
WBC: 8.3

## 2010-11-01 LAB — CK TOTAL AND CKMB (NOT AT ARMC)
CK, MB: 0.8
CK, MB: 0.9
Relative Index: INVALID
Total CK: 45
Total CK: 52

## 2010-11-01 LAB — POCT I-STAT, CHEM 8
BUN: 20
Calcium, Ion: 1.15
Chloride: 105
Creatinine, Ser: 0.7
Glucose, Bld: 128 — ABNORMAL HIGH
HCT: 37
Potassium: 4

## 2010-11-01 LAB — APTT: aPTT: 27

## 2010-11-01 LAB — TROPONIN I: Troponin I: <0.01

## 2010-11-01 LAB — MAGNESIUM: Magnesium: 1.9

## 2010-11-01 LAB — POCT CARDIAC MARKERS: Troponin i, poc: <0.05

## 2010-11-03 LAB — RAPID STREP SCREEN (MED CTR MEBANE ONLY): Streptococcus, Group A Screen (Direct): NEGATIVE

## 2010-11-13 LAB — DIFFERENTIAL
Basophils Absolute: 0
Basophils Relative: 0
Eosinophils Absolute: 0.2
Eosinophils Relative: 2
Lymphocytes Relative: 25
Lymphs Abs: 2.5
Monocytes Absolute: 0.7
Monocytes Relative: 7
Neutro Abs: 6.4
Neutrophils Relative %: 65

## 2010-11-13 LAB — CBC
HCT: 36.2
Hemoglobin: 12.8
MCHC: 35.4
MCV: 90.4
Platelets: 253
RBC: 4.01
RDW: 12.4
WBC: 9.8

## 2010-11-13 LAB — BASIC METABOLIC PANEL
BUN: 16
CO2: 24
Calcium: 8.7
Chloride: 101
Creatinine, Ser: 0.6
GFR calc Af Amer: >60
GFR calc non Af Amer: >60
Glucose, Bld: 121 — ABNORMAL HIGH
Potassium: 3.9
Sodium: 136

## 2010-11-13 LAB — URINALYSIS, ROUTINE W REFLEX MICROSCOPIC
Bilirubin Urine: NEGATIVE
Glucose, UA: NEGATIVE
Hgb urine dipstick: NEGATIVE
Ketones, ur: NEGATIVE
Nitrite: NEGATIVE
Protein, ur: NEGATIVE
Specific Gravity, Urine: 1.016
Urobilinogen, UA: 0.2
pH: 5.5

## 2010-11-13 LAB — URINE MICROSCOPIC-ADD ON

## 2010-11-16 LAB — URINALYSIS, ROUTINE W REFLEX MICROSCOPIC
Bilirubin Urine: NEGATIVE
Hgb urine dipstick: NEGATIVE
Ketones, ur: NEGATIVE
Protein, ur: NEGATIVE
Urobilinogen, UA: 0.2

## 2010-11-16 LAB — DIFFERENTIAL
Basophils Absolute: 0
Basophils Relative: 0
Lymphocytes Relative: 40
Monocytes Absolute: 0.6
Monocytes Relative: 8
Neutro Abs: 4
Neutrophils Relative %: 51

## 2010-11-16 LAB — URINE CULTURE: Colony Count: 55000

## 2010-11-16 LAB — CBC
Hemoglobin: 12.4
RBC: 3.97

## 2010-11-16 LAB — BASIC METABOLIC PANEL
Calcium: 9
Creatinine, Ser: 0.84
GFR calc Af Amer: >60
GFR calc non Af Amer: >60
Sodium: 136

## 2011-02-01 ENCOUNTER — Emergency Department (INDEPENDENT_AMBULATORY_CARE_PROVIDER_SITE_OTHER)
Admission: EM | Admit: 2011-02-01 | Discharge: 2011-02-01 | Disposition: A | Discharge status: Home / Self Care | Payer: Medicare Other | Source: Home / Self Care | Attending: Family Medicine | Admitting: Family Medicine

## 2011-02-01 DIAGNOSIS — J111 Influenza due to unidentified influenza virus with other respiratory manifestations: Secondary | ICD-10-CM

## 2011-02-01 HISTORY — DX: Essential (primary) hypertension: I10

## 2011-02-01 MED ORDER — BENZONATATE 100 MG PO CAPS: 100.0000 mg | ORAL_CAPSULE | Freq: Three times a day (TID) | ORAL | Status: DC

## 2011-02-01 NOTE — ED Provider Notes (Signed)
History     CSN: [id number]  Arrival date & time 02/01/11  1118   First MD Initiated Contact with Patient 02/01/11 1312      Chief Complaint  Patient presents with  . Influenza    (Consider location/radiation/quality/duration/timing/severity/associated sxs/prior treatment) HPI Comments: The patient reports having fever to 103, cough that is occ productive and body aches. Onset  Yesterday. States her grandchildren have had the flu. Admits to vomiting with cough and some diarrhea. Chest and head hurt to cough. Taking otc meds with minimal relief.   The history is provided by the patient.    Past Medical History  Diagnosis Date  . Diabetes mellitus   . Hypertension     Past Surgical History  Procedure Date  . Abdominal hysterectomy   . Tonsillectomy   . Knee surgery     History reviewed. No pertinent family history.  History  Substance Use Topics  . Smoking status: Never Smoker   . Smokeless tobacco: Not on file  . Alcohol Use: No    OB History    Grav Para Term Preterm Abortions TAB SAB Ect Mult Living                  Review of Systems  Constitutional: Positive for fever and chills.  HENT: Positive for congestion, rhinorrhea and postnasal drip. Negative for ear pain, neck pain and neck stiffness.   Respiratory: Positive for cough.   Gastrointestinal: Negative.   Genitourinary: Negative.   Musculoskeletal: Positive for myalgias.  Neurological: Positive for headaches.    Allergies  Chlorpromazine hcl; Codeine; Hydroxyzine pamoate; Oxycodone-acetaminophen; Prochlorperazine edisylate; and Propoxyphene n-acetaminophen  Home Medications   Current Outpatient Rx  Name Route Sig Dispense Refill  . HYDROCHLOROTHIAZIDE 25 MG PO TABS Oral Take 25 mg by mouth daily.      . INSULIN ASPART PROT & ASPART (70-30) 100 UNIT/ML [place] SUSP Subcutaneous Inject into the skin 2 (two) times daily with a meal. Takes 58 units in the am and 24 units in the pm     . LEVOTHYROXINE  SODIUM 75 MCG PO TABS Oral Take 75 mcg by mouth daily.      Marland Kitchen METFORMIN HCL 1000 MG PO TABS Oral Take 1,000 mg by mouth 2 (two) times daily with a meal.      . FISH OIL 1000 MG PO CAPS Oral Take by mouth 2 (two) times daily.      Marland Kitchen ROSUVASTATIN CALCIUM 20 MG PO TABS Oral Take 20 mg by mouth daily. 1/2 tab     . VERAPAMIL HCL 180 MG PO TBCR Oral Take 180 mg by mouth at bedtime.      Marland Kitchen BENZONATATE 100 MG PO CAPS Oral Take 1 capsule (100 mg total) by mouth every 8 (eight) hours. 21 capsule 0    BP 146/81  Pulse 95  Temp(Src) 98.4 F (36.9 C) (Oral)  Resp 20  SpO2 100%  Physical Exam  Nursing note and vitals reviewed. Constitutional: She appears well-developed and well-nourished. No distress.  HENT:  Head: Normocephalic.  Right Ear: External ear normal.  Left Ear: External ear normal.  Mouth/Throat: Oropharynx is clear and moist.  Neck: Normal range of motion. Neck supple. No thyromegaly present.  Cardiovascular: Normal rate, regular rhythm and normal heart sounds.   Pulmonary/Chest: Effort normal and breath sounds normal.       Congested cough  Lymphadenopathy:    She has no cervical adenopathy.  Skin: Skin is warm and dry.  ED Course  Procedures (including critical care time)  Labs Reviewed - No data to display No results found.   1. Influenza       MDM          Randa Spike, MD 02/01/11 1415

## 2011-02-01 NOTE — ED Notes (Signed)
C/o fever, body aches, productive cough of yellow sputum, pain in ribs, vomiting and diarrhea that started yesterday.  States last vomited around 5 am today- states the cough precipitates vomiting.

## 2011-02-03 ENCOUNTER — Emergency Department (HOSPITAL_COMMUNITY)
Admission: EM | Admit: 2011-02-03 | Discharge: 2011-02-03 | Disposition: A | Discharge status: Home / Self Care | Payer: Medicare Other | Attending: Emergency Medicine | Admitting: Emergency Medicine

## 2011-02-03 ENCOUNTER — Encounter (HOSPITAL_COMMUNITY): Payer: Self-pay

## 2011-02-03 DIAGNOSIS — R42 Dizziness and giddiness: Secondary | ICD-10-CM | POA: Insufficient documentation

## 2011-02-03 DIAGNOSIS — R197 Diarrhea, unspecified: Secondary | ICD-10-CM | POA: Insufficient documentation

## 2011-02-03 DIAGNOSIS — I10 Essential (primary) hypertension: Secondary | ICD-10-CM | POA: Insufficient documentation

## 2011-02-03 DIAGNOSIS — R112 Nausea with vomiting, unspecified: Secondary | ICD-10-CM | POA: Insufficient documentation

## 2011-02-03 DIAGNOSIS — R51 Headache: Secondary | ICD-10-CM | POA: Insufficient documentation

## 2011-02-03 DIAGNOSIS — J111 Influenza due to unidentified influenza virus with other respiratory manifestations: Secondary | ICD-10-CM | POA: Insufficient documentation

## 2011-02-03 DIAGNOSIS — Z79899 Other long term (current) drug therapy: Secondary | ICD-10-CM | POA: Insufficient documentation

## 2011-02-03 HISTORY — DX: Nonrheumatic mitral (valve) prolapse: I34.1

## 2011-02-03 MED ORDER — ONDANSETRON HCL 4 MG/2ML IJ SOLN
4.0000 mg | Freq: Once | INTRAMUSCULAR | Status: AC
  Administered 2011-02-03: 4 mg via INTRAVENOUS
  Filled 2011-02-03: qty 2

## 2011-02-03 MED ORDER — ONDANSETRON HCL 4 MG PO TABS: 4.0000 mg | ORAL_TABLET | Freq: Four times a day (QID) | ORAL | Status: AC

## 2011-02-03 MED ORDER — SODIUM CHLORIDE 0.9 % IV BOLUS (SEPSIS)
1000.0000 mL | Freq: Once | INTRAVENOUS | Status: AC
  Administered 2011-02-03: 1000 mL via INTRAVENOUS

## 2011-02-03 MED ORDER — HYDROCOD POLST-CHLORPHEN POLST 10-8 MG/5ML PO LQCR: 5.0000 mL | Freq: Two times a day (BID) | ORAL | Status: DC

## 2011-02-03 NOTE — ED Provider Notes (Signed)
History     CSN: [id number]  Arrival date & time 02/03/11  1624   First MD Initiated Contact with Patient 02/03/11 2102      Chief Complaint  Patient presents with  . Dizziness    (Consider location/radiation/quality/duration/timing/severity/associated sxs/prior treatment) HPI Comments: Patient here after having been seen at Urgent care on Wednesday and diagnosed with the flu - placed on tessalon perles and told she would gradually improve, which she did until today when she reports that her symptoms returned  - she states fever returned today at 103 - reports nausea and vomiting - she believes that she is dehydrated because she now feels dizzy with standing up.  Patient is a 61 y.o. female presenting with vomiting. The history is provided by the patient. No language interpreter was used.  Emesis  This is a recurrent problem. The current episode started more than 2 days ago. The problem occurs 2 to 4 times per day. The problem has not changed since onset.The emesis has an appearance of stomach contents. There has been no fever. Associated symptoms include diarrhea and headaches. Pertinent negatives include no abdominal pain, no arthralgias, no chills, no cough, no fever, no myalgias, no sweats and no URI. Risk factors include ill contacts.    Past Medical History  Diagnosis Date  . Diabetes mellitus   . Hypertension   . Mitral valve prolapse     Past Surgical History  Procedure Date  . Abdominal hysterectomy   . Tonsillectomy   . Knee surgery     History reviewed. No pertinent family history.  History  Substance Use Topics  . Smoking status: Never Smoker   . Smokeless tobacco: Not on file  . Alcohol Use: No    OB History    Grav Para Term Preterm Abortions TAB SAB Ect Mult Living                  Review of Systems  Constitutional: Negative for fever and chills.  Respiratory: Negative for cough.   Gastrointestinal: Positive for vomiting and diarrhea. Negative for  abdominal pain.  Musculoskeletal: Negative for myalgias and arthralgias.  Neurological: Positive for headaches.  All other systems reviewed and are negative.    Allergies  Hydroxyzine pamoate; Oxycodone-acetaminophen; Prochlorperazine edisylate; Chlorpromazine hcl; Codeine; and Propoxyphene n-acetaminophen  Home Medications   Current Outpatient Rx  Name Route Sig Dispense Refill  . BENZONATATE 100 MG PO CAPS Oral Take 100 mg by mouth every 8 (eight) hours.      Marland Kitchen HYDROCHLOROTHIAZIDE 25 MG PO TABS Oral Take 25 mg by mouth daily.     . INSULIN ASPART PROT & ASPART (70-30) 100 UNIT/ML [place] SUSP Subcutaneous Inject 24-58 Units into the skin 2 (two) times daily with a meal. Takes 58 units in the am and 24 units in the pm    . LEVOTHYROXINE SODIUM 75 MCG PO TABS Oral Take 75 mcg by mouth daily.      Marland Kitchen METFORMIN HCL 1000 MG PO TABS Oral Take 1,000 mg by mouth 2 (two) times daily with a meal.      . FISH OIL 1000 MG PO CAPS Oral Take 1 capsule by mouth 2 (two) times daily.     Marland Kitchen ROSUVASTATIN CALCIUM 20 MG PO TABS Oral Take 10 mg by mouth daily.     Marland Kitchen VERAPAMIL HCL 180 MG PO TBCR Oral Take 180 mg by mouth 2 (two) times daily.       BP 130/71  Pulse 78  Temp(Src) 97.5 F (36.4 C) (Oral)  Resp 18  SpO2 98%  Physical Exam  Nursing note and vitals reviewed. Constitutional: She is oriented to person, place, and time. She appears well-developed and well-nourished. No distress.  HENT:  Head: Normocephalic and atraumatic.  Right Ear: External ear normal.  Left Ear: External ear normal.  Nose: Nose normal.  Mouth/Throat: Oropharynx is clear and moist. No oropharyngeal exudate.  Eyes: Conjunctivae are normal. Pupils are equal, round, and reactive to light. No scleral icterus.  Neck: Normal range of motion. Neck supple.  Cardiovascular: Normal rate, regular rhythm and normal heart sounds.  Exam reveals no gallop and no friction rub.   No murmur heard. Pulmonary/Chest: Effort normal and breath  sounds normal. No respiratory distress. She has no wheezes. She exhibits no tenderness.  Abdominal: Soft. Bowel sounds are normal. She exhibits no distension. There is no tenderness.  Musculoskeletal: Normal range of motion.  Lymphadenopathy:    She has no cervical adenopathy.  Neurological: She is alert and oriented to person, place, and time. No cranial nerve deficit.  Skin: Skin is warm and dry.  Psychiatric: She has a normal mood and affect. Her behavior is normal. Judgment and thought content normal.    ED Course  Procedures (including critical care time)  Labs Reviewed - No data to display No results found.   Influenza     MDM  Patient reports improvement in dizziness after a liter of fluids - would like to get stronger cough medication if she could as well as prescription for zofran        Scarlette Calico C. [place], [place] 02/03/11 2256

## 2011-02-03 NOTE — ED Notes (Signed)
Wed am, dx with the flu, vomited today and Thursday night, diarrhea today and Wednesday, fever last night of 103.0, taking aleve, did not take any of her daily meds today

## 2011-02-03 NOTE — ED Provider Notes (Signed)
Medical screening examination/treatment/procedure(s) were performed by non-physician practitioner and as supervising physician I was immediately available for consultation/collaboration.  Leena Tiede, MD 02/03/11 2305 

## 2011-07-02 ENCOUNTER — Emergency Department (HOSPITAL_COMMUNITY)
Admission: EM | Admit: 2011-07-02 | Discharge: 2011-07-03 | Disposition: A | Discharge status: Home / Self Care | Payer: Medicare Other | Attending: Emergency Medicine | Admitting: Emergency Medicine

## 2011-07-02 ENCOUNTER — Encounter (HOSPITAL_COMMUNITY): Payer: Self-pay | Admitting: *Deleted

## 2011-07-02 ENCOUNTER — Emergency Department (HOSPITAL_COMMUNITY): Payer: Medicare Other

## 2011-07-02 DIAGNOSIS — J189 Pneumonia, unspecified organism: Secondary | ICD-10-CM | POA: Insufficient documentation

## 2011-07-02 DIAGNOSIS — E871 Hypo-osmolality and hyponatremia: Secondary | ICD-10-CM | POA: Insufficient documentation

## 2011-07-02 DIAGNOSIS — Z794 Long term (current) use of insulin: Secondary | ICD-10-CM | POA: Insufficient documentation

## 2011-07-02 DIAGNOSIS — I059 Rheumatic mitral valve disease, unspecified: Secondary | ICD-10-CM | POA: Insufficient documentation

## 2011-07-02 DIAGNOSIS — I10 Essential (primary) hypertension: Secondary | ICD-10-CM | POA: Insufficient documentation

## 2011-07-02 DIAGNOSIS — R000 Tachycardia, unspecified: Secondary | ICD-10-CM | POA: Insufficient documentation

## 2011-07-02 DIAGNOSIS — E119 Type 2 diabetes mellitus without complications: Secondary | ICD-10-CM | POA: Insufficient documentation

## 2011-07-02 DIAGNOSIS — Z79899 Other long term (current) drug therapy: Secondary | ICD-10-CM | POA: Insufficient documentation

## 2011-07-02 LAB — COMPREHENSIVE METABOLIC PANEL
Alkaline Phosphatase: 52 U/L (ref 39–117)
BUN: 13 mg/dL (ref 6–23)
Chloride: 94 mEq/L — ABNORMAL LOW (ref 96–112)
Creatinine, Ser: 0.61 mg/dL (ref 0.50–1.10)
GFR calc Af Amer: >90 mL/min (ref >90)
GFR calc non Af Amer: >90 mL/min (ref >90)
Glucose, Bld: 137 mg/dL — ABNORMAL HIGH (ref 70–99)
Potassium: 3.8 mEq/L (ref 3.5–5.1)
Total Bilirubin: 0.7 mg/dL (ref 0.3–1.2)

## 2011-07-02 LAB — DIFFERENTIAL
Basophils Relative: 0 % (ref 0–1)
Lymphs Abs: 2.4 10*3/uL (ref 0.7–4.0)
Monocytes Absolute: 1.4 10*3/uL — ABNORMAL HIGH (ref 0.1–1.0)
Monocytes Relative: 10 % (ref 3–12)
Neutro Abs: 10.4 10*3/uL — ABNORMAL HIGH (ref 1.7–7.7)
Neutrophils Relative %: 73 % (ref 43–77)

## 2011-07-02 LAB — CBC
HCT: 35.9 % — ABNORMAL LOW (ref 36.0–46.0)
Hemoglobin: 12.3 g/dL (ref 12.0–15.0)
MCH: 30.1 pg (ref 26.0–34.0)
MCHC: 34.3 g/dL (ref 30.0–36.0)
RBC: 4.08 MIL/uL (ref 3.87–5.11)

## 2011-07-02 LAB — TROPONIN I: Troponin I: <0.3 ng/mL (ref <0.30)

## 2011-07-02 MED ORDER — CEFTRIAXONE SODIUM 1 G IJ SOLR
1.0000 g | Freq: Once | INTRAMUSCULAR | Status: AC
  Administered 2011-07-02: 1 g via INTRAVENOUS
  Filled 2011-07-02: qty 10

## 2011-07-02 MED ORDER — SODIUM CHLORIDE 0.9 % IV BOLUS (SEPSIS)
1000.0000 mL | Freq: Once | INTRAVENOUS | Status: AC
  Administered 2011-07-02: 1000 mL via INTRAVENOUS

## 2011-07-02 MED ORDER — LEVOFLOXACIN 750 MG PO TABS: 750.0000 mg | ORAL_TABLET | Freq: Every day | ORAL | Status: AC

## 2011-07-02 MED ORDER — HYDROCODONE-ACETAMINOPHEN 5-325 MG PO TABS
2.0000 | ORAL_TABLET | Freq: Once | ORAL | Status: AC
  Administered 2011-07-02: 2 via ORAL
  Filled 2011-07-02: qty 2

## 2011-07-02 NOTE — ED Provider Notes (Signed)
History     CSN: [id number]  Arrival date & time 07/02/11  1846   First MD Initiated Contact with Patient 07/02/11 2045      Chief Complaint  Patient presents with  . Chest Pain    (Consider location/radiation/quality/duration/timing/severity/associated sxs/prior treatment) The history is provided by the patient.  61 y/o F with PMH DM, HTN, MVP presents to ED with c/c left-sided chest pain described as aching, moderate severity, constant since yesterday afternoon. Worse with deep breathing. Assoc with chills (temp not checked), cough, mild SOB. Pain radiates to the left side. No assoc diaphoresis. No alleviating factors, no prior treatment. No known personal hx CAD.   Past Medical History  Diagnosis Date  . Diabetes mellitus   . Hypertension   . Mitral valve prolapse     Past Surgical History  Procedure Date  . Abdominal hysterectomy   . Tonsillectomy   . Knee surgery     No family history on file.  History  Substance Use Topics  . Smoking status: Never Smoker   . Smokeless tobacco: Not on file  . Alcohol Use: No     Review of Systems 10 systems reviewed and are negative for acute change except as noted in the HPI.  Allergies  Hydroxyzine pamoate; Oxycodone-acetaminophen; Prochlorperazine edisylate; Chlorpromazine hcl; Codeine; and Propoxyphene-acetaminophen  Home Medications   Current Outpatient Rx  Name Route Sig Dispense Refill  . HYDROCHLOROTHIAZIDE 25 MG PO TABS Oral Take 25 mg by mouth daily.     . INSULIN ASPART PROT & ASPART (70-30) 100 UNIT/ML [place] SUSP Subcutaneous Inject 22-60 Units into the skin 2 (two) times daily with a meal. Takes 60 units in the am and 22 units in the pm    . LEVOTHYROXINE SODIUM 75 MCG PO TABS Oral Take 75 mcg by mouth daily.      Marland Kitchen METFORMIN HCL 1000 MG PO TABS Oral Take 1,000 mg by mouth 2 (two) times daily with a meal.      . FISH OIL 1000 MG PO CAPS Oral Take 1 capsule by mouth 2 (two) times daily.     . OXYBUTYNIN CHLORIDE  ER 10 MG PO TB24 Oral Take 10 mg by mouth daily.    Marland Kitchen ROSUVASTATIN CALCIUM 20 MG PO TABS Oral Take 10 mg by mouth daily.     Marland Kitchen VERAPAMIL HCL 180 MG PO TBCR Oral Take 180 mg by mouth 2 (two) times daily.       BP 112/56  Pulse 95  Temp(Src) 99.8 F (37.7 C) (Oral)  Resp 20  SpO2 92%  Physical Exam  Nursing note reviewed. Constitutional: She is oriented to person, place, and time. She appears well-developed and well-nourished.       VS reviewed, sig for tachycardia. Ill but non-toxic appearing  HENT:  Head: Normocephalic and atraumatic.  Right Ear: External ear normal.  Left Ear: External ear normal.  Mouth/Throat: Oropharynx is clear and moist.  Eyes: Pupils are equal, round, and reactive to light.  Neck: Neck supple.  Cardiovascular: Regular rhythm.  Tachycardia present.   Pulmonary/Chest: Effort normal and breath sounds normal. No respiratory distress. She has no wheezes. She has no rales. She exhibits tenderness (left chest wall).  Abdominal: Soft. Bowel sounds are normal. She exhibits no distension. There is no tenderness.  Musculoskeletal:       Calves supple and non-tender  Neurological: She is alert and oriented to person, place, and time. Cranial nerve deficit: 3-12 intact.  Skin: Skin is warm  and dry.    ED Course  Procedures (including critical care time)  Labs Reviewed  CBC - Abnormal; Notable for the following:    WBC 14.3 (*)    HCT 35.9 (*)    All other components within normal limits  DIFFERENTIAL - Abnormal; Notable for the following:    Neutro Abs 10.4 (*)    Monocytes Absolute 1.4 (*)    All other components within normal limits  COMPREHENSIVE METABOLIC PANEL - Abnormal; Notable for the following:    Sodium 132 (*)    Chloride 94 (*)    Glucose, Bld 137 (*)    All other components within normal limits  TROPONIN I   Dg Chest 2 View  07/02/2011  *RADIOLOGY REPORT*  Clinical Data: Left-sided chest pain and pressure.  CHEST - 2 VIEW  Comparison:  03/18/2010  Findings: Focal opacity in the lingula is compatible with pneumonia.  Follow-up imaging would be required ensure resolution as underlying neoplasm could also have this appearance.  The right lung is clear. Cardiopericardial silhouette is at upper limits of normal for size. Imaged bony structures of the thorax are intact.  IMPRESSION: Lingular opacity would be compatible pneumonia.  Follow-up imaging is recommended to ensure resolution as underlying neoplasm could also have this appearance.  Original Report Authenticated By: ERIC A. MANSELL, M.D.    Date: 07/02/2011  Rate: 105  Rhythm: sinus tachycardia  QRS Axis: normal  Intervals: normal  ST/T Wave abnormalities: normal  Conduction Disutrbances:none  Narrative Interpretation: prior EKG dated 10/06/2009, both prior and current with septal q-waves  Old EKG Reviewed: unchanged    Dx 1: CAP   MDM  Pleuritic CP x 1 day. Labs with leukocytosis, slight hyponatremia. Negative troponin with >12 hours constant pain makes ACS unlikely, esp in the setting of unchanged EKG. CXR with evidence of lingular pneumonia, c/w pt history. IV abx, 1L NS in ED. Improved tachycardia. Pt has good PCP follow-up and agrees to schedule recheck appt before the end of the week. She will be d/c home with rx for levaquin for CAP. Pt and family voice understanding of plan.        Shaaron Adler, PA-C 07/02/11 2349  Shaaron Adler, PA-C 07/02/11 2350

## 2011-07-02 NOTE — ED Provider Notes (Signed)
Medical screening examination/treatment/procedure(s) were performed by non-physician practitioner and as supervising physician I was immediately available for consultation/collaboration.  Shelda Jakes, MD 07/02/11 2351

## 2011-07-02 NOTE — ED Notes (Signed)
The pt has had some lt sided chest pain through to her posterior chest since yesterday cough and  chills

## 2011-07-02 NOTE — Discharge Instructions (Signed)
As we discussed, you are being treated for pneumonia. You should have a repeat chest x-ray after your symptoms improve to make sure the abnormal findings on your chest x-ray have gotten better. Follow-up with your doctor before the end of the week. Return to an emergency department if you feel your symptoms are worsening/concerning.     Pneumonia, Adult Pneumonia is an infection of the lungs.  CAUSES Pneumonia may be caused by bacteria or a virus. Usually, these infections are caused by breathing infectious particles into the lungs (respiratory tract). SYMPTOMS   Cough.   Fever.   Chest pain.   Increased rate of breathing.   Wheezing.   Mucus production.  DIAGNOSIS  If you have the common symptoms of pneumonia, your caregiver will typically confirm the diagnosis with a chest X-ray. The X-ray will show an abnormality in the lung (pulmonary infiltrate) if you have pneumonia. Other tests of your blood, urine, or sputum may be done to find the specific cause of your pneumonia. Your caregiver may also do tests (blood gases or pulse oximetry) to see how well your lungs are working. TREATMENT  Some forms of pneumonia may be spread to other people when you cough or sneeze. You may be asked to wear a mask before and during your exam. Pneumonia that is caused by bacteria is treated with antibiotic medicine. Pneumonia that is caused by the influenza virus may be treated with an antiviral medicine. Most other viral infections must run their course. These infections will not respond to antibiotics.  PREVENTION A pneumococcal shot (vaccine) is available to prevent a common bacterial cause of pneumonia. This is usually suggested for:  People over 61 years old.   Patients on chemotherapy.   People with chronic lung problems, such as bronchitis or emphysema.   People with immune system problems.  If you are over 65 or have a high risk condition, you may receive the pneumococcal vaccine if you have  not received it before. In some countries, a routine influenza vaccine is also recommended. This vaccine can help prevent some cases of pneumonia.You may be offered the influenza vaccine as part of your care. If you smoke, it is time to quit. You may receive instructions on how to stop smoking. Your caregiver can provide medicines and counseling to help you quit. HOME CARE INSTRUCTIONS   Cough suppressants may be used if you are losing too much rest. However, coughing protects you by clearing your lungs. You should avoid using cough suppressants if you can.   Your caregiver may have prescribed medicine if he or she thinks your pneumonia is caused by a bacteria or influenza. Finish your medicine even if you start to feel better.   Your caregiver may also prescribe an expectorant. This loosens the mucus to be coughed up.   Only take over-the-counter or prescription medicines for pain, discomfort, or fever as directed by your caregiver.   Do not smoke. Smoking is a common cause of bronchitis and can contribute to pneumonia. If you are a smoker and continue to smoke, your cough may last several weeks after your pneumonia has cleared.   A cold steam vaporizer or humidifier in your room or home may help loosen mucus.   Coughing is often worse at night. Sleeping in a semi-upright position in a recliner or using a couple pillows under your head will help with this.   Get rest as you feel it is needed. Your body will usually let you know when you  need to rest.  SEEK IMMEDIATE MEDICAL CARE IF:   Your illness becomes worse. This is especially true if you are elderly or weakened from any other disease.   You cannot control your cough with suppressants and are losing sleep.   You begin coughing up blood.   You develop pain which is getting worse or is uncontrolled with medicines.   You have a fever.   Any of the symptoms which initially brought you in for treatment are getting worse rather than  better.   You develop shortness of breath or chest pain.  MAKE SURE YOU:   Understand these instructions.   Will watch your condition.   Will get help right away if you are not doing well or get worse.  Document Released: 01/15/2005 Document Revised: 01/04/2011 Document Reviewed: 04/06/2010 [facility] Patient Information 2012 [place], [place].

## 2011-07-31 ENCOUNTER — Emergency Department (HOSPITAL_COMMUNITY): Payer: Medicare Other

## 2011-07-31 ENCOUNTER — Encounter (HOSPITAL_COMMUNITY): Payer: Self-pay | Admitting: *Deleted

## 2011-07-31 ENCOUNTER — Emergency Department (HOSPITAL_COMMUNITY)
Admission: EM | Admit: 2011-07-31 | Discharge: 2011-08-01 | Disposition: A | Discharge status: Home / Self Care | Payer: Medicare Other | Attending: Emergency Medicine | Admitting: Emergency Medicine

## 2011-07-31 DIAGNOSIS — I059 Rheumatic mitral valve disease, unspecified: Secondary | ICD-10-CM | POA: Insufficient documentation

## 2011-07-31 DIAGNOSIS — I10 Essential (primary) hypertension: Secondary | ICD-10-CM | POA: Insufficient documentation

## 2011-07-31 DIAGNOSIS — Z794 Long term (current) use of insulin: Secondary | ICD-10-CM | POA: Insufficient documentation

## 2011-07-31 DIAGNOSIS — J189 Pneumonia, unspecified organism: Secondary | ICD-10-CM

## 2011-07-31 DIAGNOSIS — E119 Type 2 diabetes mellitus without complications: Secondary | ICD-10-CM | POA: Insufficient documentation

## 2011-07-31 LAB — CBC WITH DIFFERENTIAL/PLATELET
Basophils Absolute: 0 10*3/uL (ref 0.0–0.1)
Basophils Relative: 0 % (ref 0–1)
Lymphocytes Relative: 22 % (ref 12–46)
MCHC: 34.3 g/dL (ref 30.0–36.0)
Neutro Abs: 8.2 10*3/uL — ABNORMAL HIGH (ref 1.7–7.7)
Neutrophils Relative %: 64 % (ref 43–77)
Platelets: 215 10*3/uL (ref 150–400)
RDW: 13.3 % (ref 11.5–15.5)
WBC: 12.8 10*3/uL — ABNORMAL HIGH (ref 4.0–10.5)

## 2011-07-31 LAB — COMPREHENSIVE METABOLIC PANEL
ALT: 18 U/L (ref 0–35)
AST: 18 U/L (ref 0–37)
Albumin: 3.7 g/dL (ref 3.5–5.2)
CO2: 25 mEq/L (ref 19–32)
Chloride: 89 mEq/L — ABNORMAL LOW (ref 96–112)
GFR calc non Af Amer: >90 mL/min (ref >90)
Sodium: 130 mEq/L — ABNORMAL LOW (ref 135–145)
Total Bilirubin: 0.4 mg/dL (ref 0.3–1.2)

## 2011-07-31 LAB — URINALYSIS, ROUTINE W REFLEX MICROSCOPIC
Glucose, UA: NEGATIVE mg/dL
Nitrite: NEGATIVE
Protein, ur: NEGATIVE mg/dL
Urobilinogen, UA: 1 mg/dL (ref 0.0–1.0)

## 2011-07-31 LAB — URINE MICROSCOPIC-ADD ON

## 2011-07-31 NOTE — ED Notes (Signed)
The pt had pneumonia dx one month ago.  She has a cold coughing and an elevated temp for the  Past 2-3 days

## 2011-08-01 MED ORDER — AZITHROMYCIN 250 MG PO TABS: ORAL_TABLET | ORAL | Status: AC

## 2011-08-01 MED ORDER — HYDROCODONE-HOMATROPINE 5-1.5 MG/5ML PO SYRP: 5.0000 mL | ORAL_SOLUTION | Freq: Four times a day (QID) | ORAL | Status: AC | PRN

## 2011-08-01 MED ORDER — PHENAZOPYRIDINE HCL 95 MG PO TABS: 95.0000 mg | ORAL_TABLET | Freq: Three times a day (TID) | ORAL | Status: AC | PRN

## 2011-08-01 MED ORDER — DEXTROSE 5 % IV SOLN
1.0000 g | Freq: Once | INTRAVENOUS | Status: AC
  Administered 2011-08-01: 1 g via INTRAVENOUS
  Filled 2011-08-01: qty 10

## 2011-08-01 NOTE — ED Notes (Signed)
Pt ambulated in ED. No respiratory or cardiac distress. Oxygen saturation remained 95-99% on room air. [place], [place] made aware.

## 2011-08-01 NOTE — ED Provider Notes (Signed)
History     CSN: [id number]  Arrival date & time 07/31/11  2139   First MD Initiated Contact with Patient 08/01/11 0007      Chief Complaint  Patient presents with  . Cough    (Consider location/radiation/quality/duration/timing/severity/associated sxs/prior treatment) HPI Comments: Patient is a 61 year old female with a history of mitral valve prolapse, hypertension, and diabetes presents emergency department with chief complaint of cough.  Patient was treated last month for a immediate acquired pneumonia and had felt better.  She reports going to the beach with family members that were 6 and then developing new symptoms.  Onset of symptoms began last Wednesday with a sore throat that has since resolved & nasal congestion.  Patient followed up with her primary care physician at the [facility] on Wednesday for CT of her chest.  At that time the provider told her that a residual cough can last for about a month and as long as she was afebrile she did not have to worry.  He gave her medications for symptomatic relief of her nasal congestion.  Patient began to develop chills and night sweats last evening and then noticed her temperature today was 100.1.  She took 2 Tylenol and then came to the emergency department.  Patient states her cough is more productive than it was when she was diagnosed with pneumonia.  She denies any shortness of breath, dyspnea on exertion, chest pain, hemoptysis, abdominal pain, palpitations, syncope, difficulty breathing.  Patient is a 61 y.o. female presenting with cough. The history is provided by the patient.  Cough Associated symptoms include chills, rhinorrhea, shortness of breath and wheezing. Pertinent negatives include no chest pain, no ear pain, no headaches, no sore throat and no myalgias.    Past Medical History  Diagnosis Date  . Diabetes mellitus   . Hypertension   . Mitral valve prolapse     Past Surgical History  Procedure Date  . Abdominal hysterectomy     . Tonsillectomy   . Knee surgery     No family history on file.  History  Substance Use Topics  . Smoking status: Never Smoker   . Smokeless tobacco: Not on file  . Alcohol Use: No    OB History    Grav Para Term Preterm Abortions TAB SAB Ect Mult Living                  Review of Systems  Constitutional: Positive for fever and chills. Negative for appetite change and fatigue.  HENT: Positive for congestion and rhinorrhea. Negative for hearing loss, ear pain, nosebleeds, sore throat, sneezing, trouble swallowing, neck stiffness, voice change, postnasal drip, sinus pressure, tinnitus and ear discharge.   Eyes: Negative for photophobia and visual disturbance.  Respiratory: Positive for cough, chest tightness, shortness of breath and wheezing. Negative for apnea, choking and stridor.   Cardiovascular: Negative for chest pain, palpitations and leg swelling.  Gastrointestinal: Negative for nausea, vomiting, abdominal pain, diarrhea and constipation.  Genitourinary: Positive for urgency. Negative for dysuria and flank pain.  Musculoskeletal: Negative for myalgias.  Neurological: Negative for dizziness, seizures, syncope, weakness, light-headedness, numbness and headaches.  Psychiatric/Behavioral: Negative for behavioral problems and confusion.  All other systems reviewed and are negative.    Allergies  Hydroxyzine pamoate; Oxycodone-acetaminophen; Prochlorperazine edisylate; Chlorpromazine hcl; Codeine; and Propoxyphene-acetaminophen  Home Medications   Current Outpatient Rx  Name Route Sig Dispense Refill  . ALPRAZOLAM 0.5 MG PO TABS Oral Take 0.5 mg by mouth 3 (three) times daily  as needed. For anxiety/sleep    . ATORVASTATIN CALCIUM 20 MG PO TABS Oral Take 20 mg by mouth at bedtime.    Marland Kitchen HYDROCHLOROTHIAZIDE 25 MG PO TABS Oral Take 25 mg by mouth daily.     . INSULIN ASPART PROT & ASPART (70-30) 100 UNIT/ML [place] SUSP Subcutaneous Inject 22-60 Units into the skin 2 (two) times  daily with a meal. Takes 60 units in the am and 22 units in the pm    . LEVOTHYROXINE SODIUM 75 MCG PO TABS Oral Take 75 mcg by mouth daily.      Marland Kitchen LORATADINE 10 MG PO TABS Oral Take 10 mg by mouth daily as needed. For sinus drainage    . METFORMIN HCL 1000 MG PO TABS Oral Take 1,000 mg by mouth 2 (two) times daily with a meal.      . FISH OIL 1000 MG PO CAPS Oral Take 1 capsule by mouth 2 (two) times daily.     . OXYBUTYNIN CHLORIDE ER 10 MG PO TB24 Oral Take 10 mg by mouth daily.    Marland Kitchen VERAPAMIL HCL 180 MG PO TBCR Oral Take 180 mg by mouth 2 (two) times daily.       BP 154/67  Pulse 107  Temp 99 F (37.2 C) (Oral)  Resp 22  SpO2 100%  Physical Exam  Nursing note and vitals reviewed. Constitutional: She appears well-developed and well-nourished. No distress.  HENT:  Head: Normocephalic and atraumatic.  Eyes: Conjunctivae and EOM are normal. Pupils are equal, round, and reactive to light.  Neck: Normal range of motion. Neck supple. Normal carotid pulses and no JVD present. Carotid bruit is not present. No rigidity. Normal range of motion present.  Cardiovascular: Normal rate, regular rhythm, S1 normal, S2 normal, normal heart sounds, intact distal pulses and normal pulses.  Exam reveals no gallop and no friction rub.   No murmur heard.      No pitting edema bilaterally, RRR, no aberrant sounds on auscultations, distal pulses intact, no carotid bruit or JVD.   Pulmonary/Chest: Effort normal and breath sounds normal. No accessory muscle usage or stridor. No respiratory distress. She exhibits no tenderness and no bony tenderness.       LCAB, no acute respiratory distress.   Abdominal: Bowel sounds are normal.       Soft non tender. Non pulsatile aorta.   Skin: Skin is warm, dry and intact. No rash noted. She is not diaphoretic. No cyanosis. Nails show no clubbing.    ED Course  Procedures (including critical care time)  Labs Reviewed  URINALYSIS, ROUTINE W REFLEX MICROSCOPIC -  Abnormal; Notable for the following:    APPearance CLOUDY (*)     Hgb urine dipstick MODERATE (*)     Leukocytes, UA LARGE (*)     All other components within normal limits  CBC WITH DIFFERENTIAL - Abnormal; Notable for the following:    WBC 12.8 (*)     RBC 3.78 (*)     Hemoglobin 11.3 (*)     HCT 32.9 (*)     Neutro Abs 8.2 (*)     Monocytes Absolute 1.5 (*)     All other components within normal limits  COMPREHENSIVE METABOLIC PANEL - Abnormal; Notable for the following:    Sodium 130 (*)     Chloride 89 (*)     Glucose, Bld 111 (*)     All other components within normal limits  URINE MICROSCOPIC-ADD ON - Abnormal; Notable for  the following:    Squamous Epithelial / LPF FEW (*)     All other components within normal limits  URINE CULTURE   Dg Chest 2 View  07/31/2011  *RADIOLOGY REPORT*  Clinical Data: Cough.  CHEST - 2 VIEW  Comparison: 07/02/2011  Findings: Opacity in the left midlung has improved since prior study, likely improving pneumonia.  Heart is normal size.  Right lung is clear.  No effusions.  Degenerative changes in the thoracic spine.  IMPRESSION: Improving lingular pneumonia.  Original Report Authenticated By: Cyndie Chime, M.D.     No diagnosis found.    MDM  Improving PNA   Imaging labs reviewed.  Patient with improving pneumonia and white blood count, however her symptoms have returned after being at baseline.  Patient states that she was febrile earlier and took Tylenol prior to arrival and she has had a productive cough and chills.  Patient will be treated with a gram of Rocephin emergency department and discharge with a Z-Pak.  She is been advised to continue to followup with Dr. Erma Heritage.  Patient oxygen saturation maintained greater than 95 while ambulating.  She is hemodynamically stable and in no acute distress prior to discharge.        Jaci Carrel, [place] 08/01/11 [phone]

## 2011-08-01 NOTE — ED Provider Notes (Signed)
Medical screening examination/treatment/procedure(s) were performed by non-physician practitioner and as supervising physician I was immediately available for consultation/collaboration.  Doug Sou, MD 08/01/11 [phone]

## 2011-08-01 NOTE — ED Notes (Signed)
Pt stated that she was diagnosed with PNA 2 weeks ago and treated. She went to the beach 1 week ago and developed a cough. Cough has been congested with no production. She is also complaining of sore throat with cough. Her primary doctor gave her medication and stated it was d/t drainage. She stated tonight she begin having chills. No n/v, cp, or respiratory distress. Will continue to monitor.

## 2011-08-04 LAB — URINE CULTURE

## 2011-08-05 NOTE — ED Notes (Signed)
+   Urine Chart sent to EDP office for review. 

## 2011-08-08 NOTE — ED Notes (Signed)
No change-Have PCP recheck urine in 1 week per Marlon Pel.

## 2012-02-24 ENCOUNTER — Encounter (HOSPITAL_COMMUNITY): Payer: Self-pay | Admitting: Cardiology

## 2012-02-24 ENCOUNTER — Emergency Department (HOSPITAL_COMMUNITY)
Admission: EM | Admit: 2012-02-24 | Discharge: 2012-02-24 | Disposition: A | Discharge status: Home / Self Care | Payer: Medicare Other | Attending: Emergency Medicine | Admitting: Emergency Medicine

## 2012-02-24 DIAGNOSIS — I10 Essential (primary) hypertension: Secondary | ICD-10-CM | POA: Insufficient documentation

## 2012-02-24 DIAGNOSIS — R350 Frequency of micturition: Secondary | ICD-10-CM | POA: Insufficient documentation

## 2012-02-24 DIAGNOSIS — Z90710 Acquired absence of both cervix and uterus: Secondary | ICD-10-CM | POA: Insufficient documentation

## 2012-02-24 DIAGNOSIS — Z8679 Personal history of other diseases of the circulatory system: Secondary | ICD-10-CM | POA: Insufficient documentation

## 2012-02-24 DIAGNOSIS — Z79899 Other long term (current) drug therapy: Secondary | ICD-10-CM | POA: Insufficient documentation

## 2012-02-24 DIAGNOSIS — E119 Type 2 diabetes mellitus without complications: Secondary | ICD-10-CM | POA: Insufficient documentation

## 2012-02-24 DIAGNOSIS — N390 Urinary tract infection, site not specified: Secondary | ICD-10-CM | POA: Insufficient documentation

## 2012-02-24 DIAGNOSIS — R319 Hematuria, unspecified: Secondary | ICD-10-CM | POA: Insufficient documentation

## 2012-02-24 DIAGNOSIS — R300 Dysuria: Secondary | ICD-10-CM | POA: Insufficient documentation

## 2012-02-24 LAB — BASIC METABOLIC PANEL
BUN: 13 mg/dL (ref 6–23)
CO2: 25 mEq/L (ref 19–32)
Calcium: 10.1 mg/dL (ref 8.4–10.5)
Chloride: 100 mEq/L (ref 96–112)
Creatinine, Ser: 0.65 mg/dL (ref 0.50–1.10)
Glucose, Bld: 143 mg/dL — ABNORMAL HIGH (ref 70–99)

## 2012-02-24 LAB — CBC
HCT: 39 % (ref 36.0–46.0)
MCH: 29.7 pg (ref 26.0–34.0)
MCV: 87.6 fL (ref 78.0–100.0)
RBC: 4.45 MIL/uL (ref 3.87–5.11)
WBC: 10.1 10*3/uL (ref 4.0–10.5)

## 2012-02-24 LAB — URINALYSIS, ROUTINE W REFLEX MICROSCOPIC
Bilirubin Urine: NEGATIVE
Glucose, UA: NEGATIVE mg/dL
Specific Gravity, Urine: 1.004 — ABNORMAL LOW (ref 1.005–1.030)
Urobilinogen, UA: 0.2 mg/dL (ref 0.0–1.0)

## 2012-02-24 MED ORDER — HYDROMORPHONE HCL PF 1 MG/ML IJ SOLN
1.0000 mg | Freq: Once | INTRAMUSCULAR | Status: DC
  Filled 2012-02-24: qty 1

## 2012-02-24 MED ORDER — CIPROFLOXACIN HCL 500 MG PO TABS: 500.0000 mg | ORAL_TABLET | Freq: Two times a day (BID) | ORAL | Status: DC

## 2012-02-24 MED ORDER — CIPROFLOXACIN HCL 500 MG PO TABS
500.0000 mg | ORAL_TABLET | Freq: Once | ORAL | Status: AC
  Administered 2012-02-24: 500 mg via ORAL
  Filled 2012-02-24: qty 1

## 2012-02-24 MED ORDER — KETOROLAC TROMETHAMINE 60 MG/2ML IM SOLN
60.0000 mg | Freq: Once | INTRAMUSCULAR | Status: AC
  Administered 2012-02-24: 60 mg via INTRAMUSCULAR
  Filled 2012-02-24: qty 2

## 2012-02-24 MED ORDER — TRAMADOL HCL 50 MG PO TABS: 50.0000 mg | ORAL_TABLET | Freq: Four times a day (QID) | ORAL | Status: DC | PRN

## 2012-02-24 NOTE — ED Provider Notes (Signed)
History     CSN: [id number]  Arrival date & time 02/24/12  1734   First MD Initiated Contact with Patient 02/24/12 1801      Chief Complaint  Patient presents with  . Back Pain  . Urinary Tract Infection    (Consider location/radiation/quality/duration/timing/severity/associated sxs/prior treatment) Patient is a 62 y.o. female presenting with back pain and urinary tract infection. The history is provided by the patient (the pt states she has back pain,  pain with urination,  blood in urine and has been taking macrodantin). No language interpreter was used.  Back Pain  This is a new problem. The current episode started 2 days ago. The problem occurs constantly. The problem has not changed since onset.The pain is associated with no known injury. The pain is present in the lumbar spine. The quality of the pain is described as aching. The pain does not radiate. The pain is at a severity of 5/10. Pertinent negatives include no chest pain, no headaches and no abdominal pain.  Urinary Tract Infection Pertinent negatives include no chest pain, no abdominal pain and no headaches.    Past Medical History  Diagnosis Date  . Diabetes mellitus   . Hypertension   . Mitral valve prolapse     Past Surgical History  Procedure Date  . Abdominal hysterectomy   . Tonsillectomy   . Knee surgery     History reviewed. No pertinent family history.  History  Substance Use Topics  . Smoking status: Never Smoker   . Smokeless tobacco: Not on file  . Alcohol Use: No    OB History    Grav Para Term Preterm Abortions TAB SAB Ect Mult Living                  Review of Systems  Constitutional: Negative for fatigue.  HENT: Negative for congestion, sinus pressure and ear discharge.   Eyes: Negative for discharge.  Respiratory: Negative for cough.   Cardiovascular: Negative for chest pain.  Gastrointestinal: Negative for abdominal pain and diarrhea.  Genitourinary: Positive for frequency and  hematuria.  Musculoskeletal: Positive for back pain.  Skin: Negative for rash.  Neurological: Negative for seizures and headaches.  Hematological: Negative.   Psychiatric/Behavioral: Negative for hallucinations.    Allergies  Hydroxyzine pamoate; Oxycodone-acetaminophen; Prochlorperazine edisylate; Chlorpromazine hcl; Codeine; and Propoxyphene-acetaminophen  Home Medications   Current Outpatient Rx  Name  Route  Sig  Dispense  Refill  . ALPRAZOLAM 0.5 MG PO TABS   Oral   Take 0.5 mg by mouth 3 (three) times daily as needed. For anxiety/sleep         . ATORVASTATIN CALCIUM 20 MG PO TABS   Oral   Take 20 mg by mouth at bedtime.         . ESTROGENS, CONJUGATED 0.625 MG/GM VA CREA   Vaginal   Place 1 g vaginally 3 (three) times a week.         Marland Kitchen HYDROCHLOROTHIAZIDE 25 MG PO TABS   Oral   Take 25 mg by mouth daily.          Marland Kitchen HYDROCODONE-ACETAMINOPHEN 5-325 MG PO TABS   Oral   Take 1 tablet by mouth every 6 (six) hours as needed. For pain         . INSULIN ASPART PROT & ASPART (70-30) 100 UNIT/ML [place] SUSP   Subcutaneous   Inject 36-74 Units into the skin 2 (two) times daily with a meal. Takes 74 units in the am  and 36 units in the pm         . LEVOTHYROXINE SODIUM 75 MCG PO TABS   Oral   Take 75 mcg by mouth daily.           Marland Kitchen LORATADINE 10 MG PO TABS   Oral   Take 10 mg by mouth daily as needed. For sinus drainage         . METFORMIN HCL 1000 MG PO TABS   Oral   Take 1,000 mg by mouth 2 (two) times daily with a meal.           . NITROFURANTOIN MONOHYD MACRO 100 MG PO CAPS   Oral   Take 100 mg by mouth 2 (two) times daily.         Marland Kitchen FISH OIL 1000 MG PO CAPS   Oral   Take 1 capsule by mouth 2 (two) times daily.          . OXYBUTYNIN CHLORIDE ER 10 MG PO TB24   Oral   Take 10 mg by mouth daily.         Marland Kitchen VERAPAMIL HCL 180 MG PO TBCR   Oral   Take 180 mg by mouth 2 (two) times daily.            BP 124/71  Pulse 77  Temp 97.9 F  (36.6 C) (Oral)  Resp 18  SpO2 96%  Physical Exam  Constitutional: She is oriented to person, place, and time. She appears well-developed.  HENT:  Head: Normocephalic and atraumatic.  Eyes: Conjunctivae normal and EOM are normal. No scleral icterus.  Neck: Neck supple. No thyromegaly present.  Cardiovascular: Normal rate and regular rhythm.  Exam reveals no gallop and no friction rub.   No murmur heard. Pulmonary/Chest: No stridor. She has no wheezes. She has no rales. She exhibits no tenderness.  Abdominal: She exhibits no distension. There is no tenderness. There is no rebound.  Genitourinary:       Tender left and right flank  Musculoskeletal: Normal range of motion. She exhibits no edema.  Lymphadenopathy:    She has no cervical adenopathy.  Neurological: She is oriented to person, place, and time. Coordination normal.  Skin: No rash noted. No erythema.  Psychiatric: She has a normal mood and affect. Her behavior is normal.    ED Course  Procedures (including critical care time)  Labs Reviewed  URINALYSIS, ROUTINE W REFLEX MICROSCOPIC - Abnormal; Notable for the following:    Specific Gravity, Urine 1.004 (*)     Hgb urine dipstick TRACE (*)     All other components within normal limits  BASIC METABOLIC PANEL - Abnormal; Notable for the following:    Glucose, Bld 143 (*)     All other components within normal limits  CBC  URINE MICROSCOPIC-ADD ON  URINE CULTURE   No results found.   1. UTI (lower urinary tract infection)       MDM          Benny Lennert, MD 02/24/12 1945

## 2012-02-24 NOTE — ED Notes (Signed)
Pt reports she was dx with UTI and had blood in her urine about a week ago. States she was placed on antibiotics. Reports she started feeling worse on Friday night and noticed more blood in her urine and is having increased pain in her lower back and when she urinates.

## 2012-02-26 LAB — URINE CULTURE: Colony Count: 25000

## 2012-03-16 ENCOUNTER — Encounter (HOSPITAL_COMMUNITY): Payer: Self-pay | Admitting: Emergency Medicine

## 2012-03-16 ENCOUNTER — Emergency Department (HOSPITAL_COMMUNITY)
Admission: EM | Admit: 2012-03-16 | Discharge: 2012-03-16 | Disposition: A | Discharge status: Home / Self Care | Payer: Medicare Other | Attending: Emergency Medicine | Admitting: Emergency Medicine

## 2012-03-16 ENCOUNTER — Emergency Department (HOSPITAL_COMMUNITY): Payer: Medicare Other

## 2012-03-16 DIAGNOSIS — Z79899 Other long term (current) drug therapy: Secondary | ICD-10-CM | POA: Insufficient documentation

## 2012-03-16 DIAGNOSIS — R319 Hematuria, unspecified: Secondary | ICD-10-CM | POA: Insufficient documentation

## 2012-03-16 DIAGNOSIS — Z90710 Acquired absence of both cervix and uterus: Secondary | ICD-10-CM | POA: Insufficient documentation

## 2012-03-16 DIAGNOSIS — I10 Essential (primary) hypertension: Secondary | ICD-10-CM | POA: Insufficient documentation

## 2012-03-16 DIAGNOSIS — M549 Dorsalgia, unspecified: Secondary | ICD-10-CM

## 2012-03-16 DIAGNOSIS — E119 Type 2 diabetes mellitus without complications: Secondary | ICD-10-CM | POA: Insufficient documentation

## 2012-03-16 DIAGNOSIS — M545 Low back pain, unspecified: Secondary | ICD-10-CM | POA: Insufficient documentation

## 2012-03-16 DIAGNOSIS — Z794 Long term (current) use of insulin: Secondary | ICD-10-CM | POA: Insufficient documentation

## 2012-03-16 DIAGNOSIS — R109 Unspecified abdominal pain: Secondary | ICD-10-CM | POA: Insufficient documentation

## 2012-03-16 DIAGNOSIS — Z8679 Personal history of other diseases of the circulatory system: Secondary | ICD-10-CM | POA: Insufficient documentation

## 2012-03-16 LAB — URINALYSIS, ROUTINE W REFLEX MICROSCOPIC
Glucose, UA: NEGATIVE mg/dL
Ketones, ur: NEGATIVE mg/dL
Leukocytes, UA: NEGATIVE
Protein, ur: NEGATIVE mg/dL
Urobilinogen, UA: 0.2 mg/dL (ref 0.0–1.0)

## 2012-03-16 LAB — URINE MICROSCOPIC-ADD ON

## 2012-03-16 LAB — POCT I-STAT, CHEM 8
BUN: 13 mg/dL (ref 6–23)
Calcium, Ion: 1.14 mmol/L (ref 1.13–1.30)
Hemoglobin: 14.3 g/dL (ref 12.0–15.0)
TCO2: 26 mmol/L (ref 0–100)

## 2012-03-16 MED ORDER — HYDROCODONE-ACETAMINOPHEN 5-325 MG PO TABS: 1.0000 | ORAL_TABLET | Freq: Four times a day (QID) | ORAL | Status: AC | PRN

## 2012-03-16 MED ORDER — HYDROCODONE-ACETAMINOPHEN 5-325 MG PO TABS
2.0000 | ORAL_TABLET | Freq: Once | ORAL | Status: AC
  Administered 2012-03-16: 2 via ORAL
  Filled 2012-03-16: qty 2

## 2012-03-16 NOTE — ED Notes (Addendum)
Pt states that she was treated for uti 1/27 and it got better, but on Friday pt c/o urinary pressure, back pain, chills, urinary frequency, and diarrhea. Pt states she took aleve 1.5 hrs PTA

## 2012-03-16 NOTE — ED Provider Notes (Signed)
History     CSN: [id number]  Arrival date & time 03/16/12  1444   First MD Initiated Contact with Patient 03/16/12 1608      Chief Complaint  Patient presents with  . Back Pain  . Hematuria    (Consider location/radiation/quality/duration/timing/severity/associated sxs/prior treatment) Patient is a 62 y.o. female presenting with back pain and hematuria. The history is provided by the patient.  Back Pain Associated symptoms: no abdominal pain, no chest pain, no fever and no headaches   Hematuria Pertinent negatives include no chest pain, no abdominal pain, no headaches and no shortness of breath.  pt c/o left flank pain posteriorly and hematuria intermittently in past 3 weeks. States seen prior in ed, was given meds for her chronic musculoskeletal back pain then, but states this seems different. Denies hx kidney stones. No dysuria. No vaginal bleeding, notes remote hx hysterectomy. Denies hx malignancy. No anterior/abdominal pain. No vomiting. No diarrhea or constipation. Denies fever or chills. No back strain or fall. No leg numbness/weakness.     Past Medical History  Diagnosis Date  . Diabetes mellitus   . Hypertension   . Mitral valve prolapse     Past Surgical History  Procedure Laterality Date  . Abdominal hysterectomy    . Tonsillectomy    . Knee surgery      No family history on file.  History  Substance Use Topics  . Smoking status: Never Smoker   . Smokeless tobacco: Not on file  . Alcohol Use: No    OB History   Grav Para Term Preterm Abortions TAB SAB Ect Mult Living                  Review of Systems  Constitutional: Negative for fever and chills.  HENT: Negative for neck pain.   Eyes: Negative for redness.  Respiratory: Negative for shortness of breath.   Cardiovascular: Negative for chest pain.  Gastrointestinal: Negative for abdominal pain.  Genitourinary: Positive for hematuria and flank pain.  Musculoskeletal: Positive for back pain.   Skin: Negative for rash.  Neurological: Negative for headaches.  Hematological: Does not bruise/bleed easily.  Psychiatric/Behavioral: Negative for confusion.    Allergies  Hydroxyzine pamoate; Oxycodone-acetaminophen; Prochlorperazine edisylate; Chlorpromazine hcl; Codeine; and Propoxyphene-acetaminophen  Home Medications   Current Outpatient Rx  Name  Route  Sig  Dispense  Refill  . ALPRAZolam (XANAX) 0.5 MG tablet   Oral   Take 0.5 mg by mouth 3 (three) times daily as needed. For anxiety/sleep         . atorvastatin (LIPITOR) 20 MG tablet   Oral   Take 20 mg by mouth at bedtime.         . conjugated estrogens (PREMARIN) vaginal cream   Vaginal   Place 1 g vaginally 3 (three) times a week.         . hydrochlorothiazide (HYDRODIURIL) 25 MG tablet   Oral   Take 25 mg by mouth daily.          Marland Kitchen HYDROcodone-acetaminophen (NORCO/VICODIN) 5-325 MG per tablet   Oral   Take 1 tablet by mouth every 6 (six) hours as needed. For pain         . insulin aspart protamine-insulin aspart (NOVOLOG 70/30) (70-30) 100 UNIT/ML injection   Subcutaneous   Inject 36-76 Units into the skin 2 (two) times daily with a meal. Takes 76 units in the am and 36 units in the pm         .  levothyroxine (SYNTHROID, LEVOTHROID) 75 MCG tablet   Oral   Take 75 mcg by mouth daily.           . metFORMIN (GLUCOPHAGE) 1000 MG tablet   Oral   Take 1,000 mg by mouth 2 (two) times daily with a meal.           . Naproxen Sodium (ALEVE) 220 MG CAPS   Oral   Take 2 capsules by mouth daily as needed (pain).         . Omega-3 Fatty Acids (FISH OIL) 1000 MG CAPS   Oral   Take 1 capsule by mouth 2 (two) times daily.          Marland Kitchen oxybutynin (DITROPAN-XL) 10 MG 24 hr tablet   Oral   Take 10 mg by mouth daily.         . traMADol (ULTRAM) 50 MG tablet   Oral   Take 1 tablet (50 mg total) by mouth every 6 (six) hours as needed for pain.   15 tablet   0   . verapamil (CALAN-SR) 180 MG CR  tablet   Oral   Take 180 mg by mouth 2 (two) times daily.          Marland Kitchen loratadine (CLARITIN) 10 MG tablet   Oral   Take 10 mg by mouth daily as needed. For sinus drainage         . Polyvinyl Alcohol-Povidone (REFRESH OP)   Both Eyes   Place 2 drops into both eyes as needed (for dry eyes).           BP 118/72  Pulse 90  Temp(Src) 97.9 F (36.6 C) (Oral)  Resp 18  SpO2 100%  Physical Exam  Nursing note and vitals reviewed. Constitutional: She is oriented to person, place, and time. She appears well-developed and well-nourished. No distress.  Eyes: Conjunctivae are normal. No scleral icterus.  Neck: Neck supple. No tracheal deviation present.  Cardiovascular: Normal rate, regular rhythm, normal heart sounds and intact distal pulses.   Pulmonary/Chest: Effort normal and breath sounds normal. No respiratory distress.  Abdominal: Soft. Normal appearance and bowel sounds are normal. She exhibits no distension and no mass. There is no tenderness. There is no rebound and no guarding.  Genitourinary:  No cva tenderness  Musculoskeletal: She exhibits no edema and no tenderness.  tls spine non tender, left lumbar tenderness  Neurological: She is alert and oriented to person, place, and time.  Motor intact bil.   Skin: Skin is warm and dry. No rash noted.  Psychiatric: She has a normal mood and affect.    ED Course  Procedures (including critical care time)  Results for orders placed during the hospital encounter of 03/16/12  URINALYSIS, ROUTINE W REFLEX MICROSCOPIC      Result Value Range   Color, Urine YELLOW  YELLOW   APPearance CLEAR  CLEAR   Specific Gravity, Urine 1.009  1.005 - 1.030   pH 6.0  5.0 - 8.0   Glucose, UA NEGATIVE  NEGATIVE mg/dL   Hgb urine dipstick TRACE (*) NEGATIVE   Bilirubin Urine NEGATIVE  NEGATIVE   Ketones, ur NEGATIVE  NEGATIVE mg/dL   Protein, ur NEGATIVE  NEGATIVE mg/dL   Urobilinogen, UA 0.2  0.0 - 1.0 mg/dL   Nitrite NEGATIVE  NEGATIVE    Leukocytes, UA NEGATIVE  NEGATIVE  URINE MICROSCOPIC-ADD ON      Result Value Range   Squamous Epithelial / LPF RARE  RARE  WBC, UA 0-2  <3 WBC/hpf   RBC / HPF 0-2  <3 RBC/hpf   Urine-Other MUCOUS PRESENT    POCT I-STAT, CHEM 8      Result Value Range   Sodium 137  135 - 145 mEq/L   Potassium 4.0  3.5 - 5.1 mEq/L   Chloride 101  96 - 112 mEq/L   BUN 13  6 - 23 mg/dL   Creatinine, Ser 9.60  0.50 - 1.10 mg/dL   Glucose, Bld 454 (*) 70 - 99 mg/dL   Calcium, Ion 0.98  1.19 - 1.30 mmol/L   TCO2 26  0 - 100 mmol/L   Hemoglobin 14.3  12.0 - 15.0 g/dL   HCT 14.7  82.9 - 56.2 %   Ct Abdomen Pelvis Wo Contrast  03/16/2012  *RADIOLOGY REPORT*  Clinical Data: Low back pain.  Hematuria.  Increasing symptoms.  CT ABDOMEN AND PELVIS WITHOUT CONTRAST  Technique:  Multidetector CT imaging of the abdomen and pelvis was performed following the standard protocol without intravenous contrast.  Comparison: 02/28/2009.  Findings: Lung Bases: Clear.  Liver:  Unenhanced CT was performed per clinician order.  Lack of IV contrast limits sensitivity and specificity, especially for evaluation of abdominal/pelvic solid viscera.  Grossly normal.  Spleen:  Normal.  Gallbladder:  Cholecystectomy.  Common bile duct:  Normal.  Pancreas:  Normal.  Adrenal glands:  Normal.  Kidneys:  No renal calculi or hydronephrosis.  No inflammatory changes.  Both ureters appear within normal limits.  Stomach:  Grossly normal.  Small bowel:  No Adenopathy or free air.  The proximal jejunum demonstrates areas of dilation and mural thickening which are suboptimally evaluated in the absence of oral contrast. There is a small area of fecalization of small bowel in the jejunum (image number 71 series 5) suggesting an obstructive component may be present.  More distal jejunal loops appear normal.  There may be some mural thickening in the fourth part of the duodenum as well.  Colon:   No right lower quadrant inflammatory changes.  The appendix is  probably collapsed. No colonic inflammatory changes.  Pelvic Genitourinary:  Mural thickening of the urinary bladder which potentially could be associated with cystitis but may be due to under distention.    No free fluid.  Hysterectomy.  Thickening of the right labia, likely representing Bartholin's cyst.  Bones:  No aggressive osseous lesions.  Soft tissue density is present in the proximal left femur shaft marrow cavity which is unchanged compared to 02/28/2009.  This is only partially visualized however the stability favors a benign process.  Vasculature: Mild atherosclerosis.  Injection granuloma in the left gluteal soft tissues.  IMPRESSION: 1. Short segment mural thickening and mild dilation of proximal jejunum.  The appearance is nonspecific.  This could be associated with infection, inflammatory bowel disease, or neoplasm of small bowel such as lymphoma. Ischemic bowel is considered highly unlikely based on paucity of atherosclerosis.  This is suboptimally evaluated in the absence of oral and IV contrast. If symptoms persist, CT with oral and IV contrast may be useful.  There may be an element of bowel obstruction however if so, this is low grade. 2. Mild bladder mural thickening is favored be due to under distention.  Correlation with urinalysis recommended. 3.  No renal calculi or hydronephrosis. 4. Cholecystectomy and hysterectomy.   Original Report Authenticated By: Andreas Newport, M.D.        MDM  Labs. Ct.  Reviewed nursing notes and prior charts for  additional history.   vicodin po.   No hematuria on labs, no uti. No ureteral stone on ct. Pt with hx back pain.  Discussed incidentally noted ct findings w pt  - pt denies any abdominal pain or distension, no recent nvd. No wt loss.  [facility] for possible repeat/further imaging.   Pt appears stable for d/c.         Suzi Roots, MD 03/16/12 [phone]
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
## 2012-08-16 ENCOUNTER — Emergency Department: Payer: Self-pay | Admitting: Emergency Medicine

## 2012-08-16 LAB — URINALYSIS, COMPLETE
Bilirubin,UR: NEGATIVE
Glucose,UR: NEGATIVE mg/dL
Ketone: NEGATIVE
Nitrite: NEGATIVE
Ph: 7
Protein: NEGATIVE
RBC,UR: 1 /HPF
Specific Gravity: 1.005
Squamous Epithelial: <1
WBC UR: 203 /HPF

## 2012-08-16 LAB — COMPREHENSIVE METABOLIC PANEL
Alkaline Phosphatase: 78 U/L (ref 50–136)
Anion Gap: 6 — ABNORMAL LOW (ref 7–16)
BUN: 23 mg/dL — ABNORMAL HIGH (ref 7–18)
Bilirubin,Total: 0.3 mg/dL (ref 0.2–1.0)
Calcium, Total: 9.3 mg/dL (ref 8.5–10.1)
Chloride: 104 mmol/L (ref 98–107)
Creatinine: 0.8 mg/dL (ref 0.60–1.30)
EGFR (African American): >60
EGFR (Non-African Amer.): >60
Osmolality: 279 (ref 275–301)
SGPT (ALT): 33 U/L (ref 12–78)
Total Protein: 8 g/dL (ref 6.4–8.2)

## 2012-08-16 LAB — CBC
HCT: 37.7 %
HGB: 12.4 g/dL
MCH: 28.9 pg
MCHC: 32.9 g/dL
MCV: 88 fL
Platelet: 242 x10 3/mm 3
RBC: 4.29 X10 6/mm 3
RDW: 13.7 %
WBC: 13.2 x10 3/mm 3 — ABNORMAL HIGH

## 2012-08-16 LAB — TROPONIN I: Troponin-I: <0.02 ng/mL

## 2012-08-16 LAB — CK TOTAL AND CKMB (NOT AT ARMC)
CK, Total: 60 U/L
CK-MB: 0.9 ng/mL

## 2012-10-31 ENCOUNTER — Emergency Department: Payer: Self-pay | Admitting: Emergency Medicine

## 2012-10-31 LAB — CBC
MCH: 30.5 pg (ref 26.0–34.0)
MCHC: 34.6 g/dL (ref 32.0–36.0)
RBC: 4.04 10*6/uL (ref 3.80–5.20)
RDW: 13 % (ref 11.5–14.5)
WBC: 11.5 10*3/uL — ABNORMAL HIGH (ref 3.6–11.0)

## 2012-10-31 LAB — TROPONIN I: Troponin-I: <0.02 ng/mL

## 2012-10-31 LAB — BASIC METABOLIC PANEL
Anion Gap: 7 (ref 7–16)
BUN: 18 mg/dL (ref 7–18)
Calcium, Total: 8.9 mg/dL (ref 8.5–10.1)
Co2: 26 mmol/L (ref 21–32)
Creatinine: 1.09 mg/dL (ref 0.60–1.30)
Sodium: 137 mmol/L (ref 136–145)

## 2012-10-31 LAB — CK TOTAL AND CKMB (NOT AT ARMC): CK-MB: 1.1 ng/mL (ref 0.5–3.6)

## 2012-12-08 IMAGING — CR DG CHEST 2V
2 series · 2 of 2 positions shown · non-contrast
Comparison: 07/02/2011

CLINICAL DATA: Cough.

CHEST - 2 VIEW

[w chest pa]
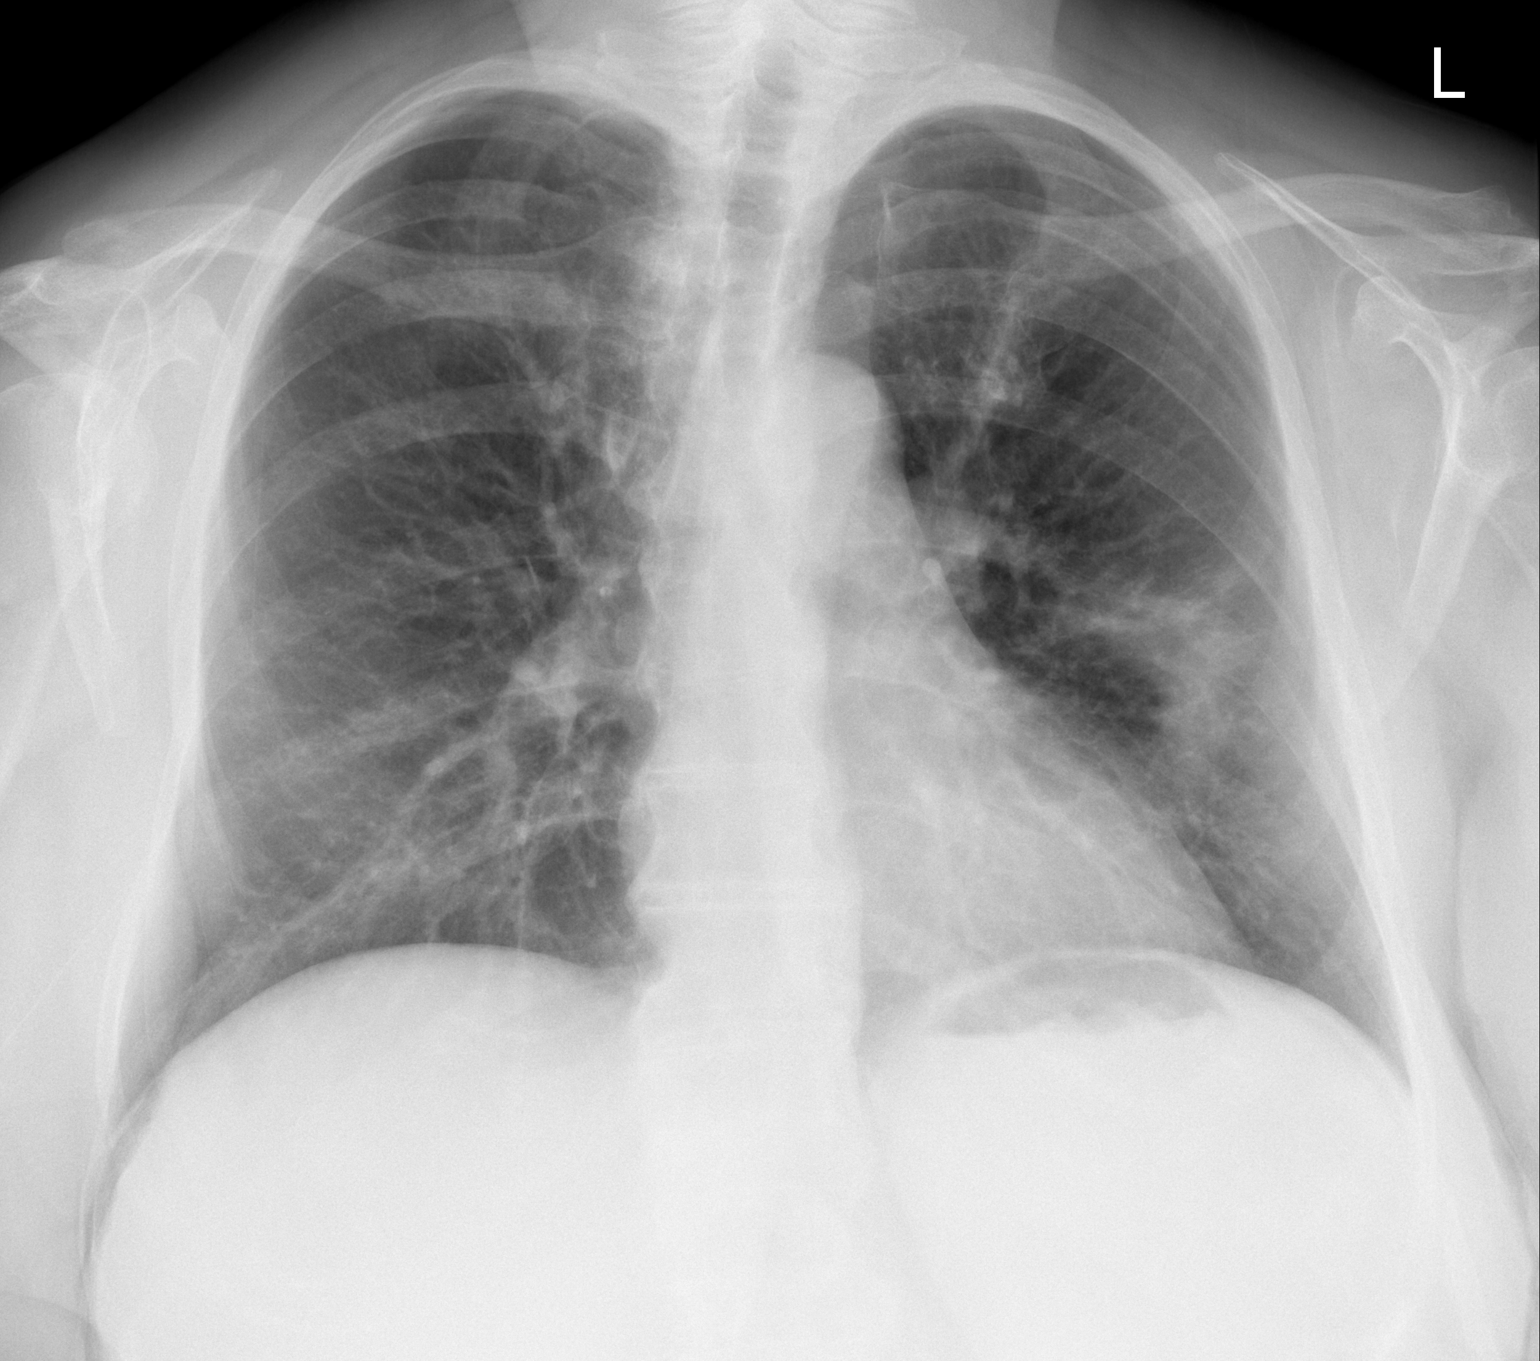

[w chest lat]
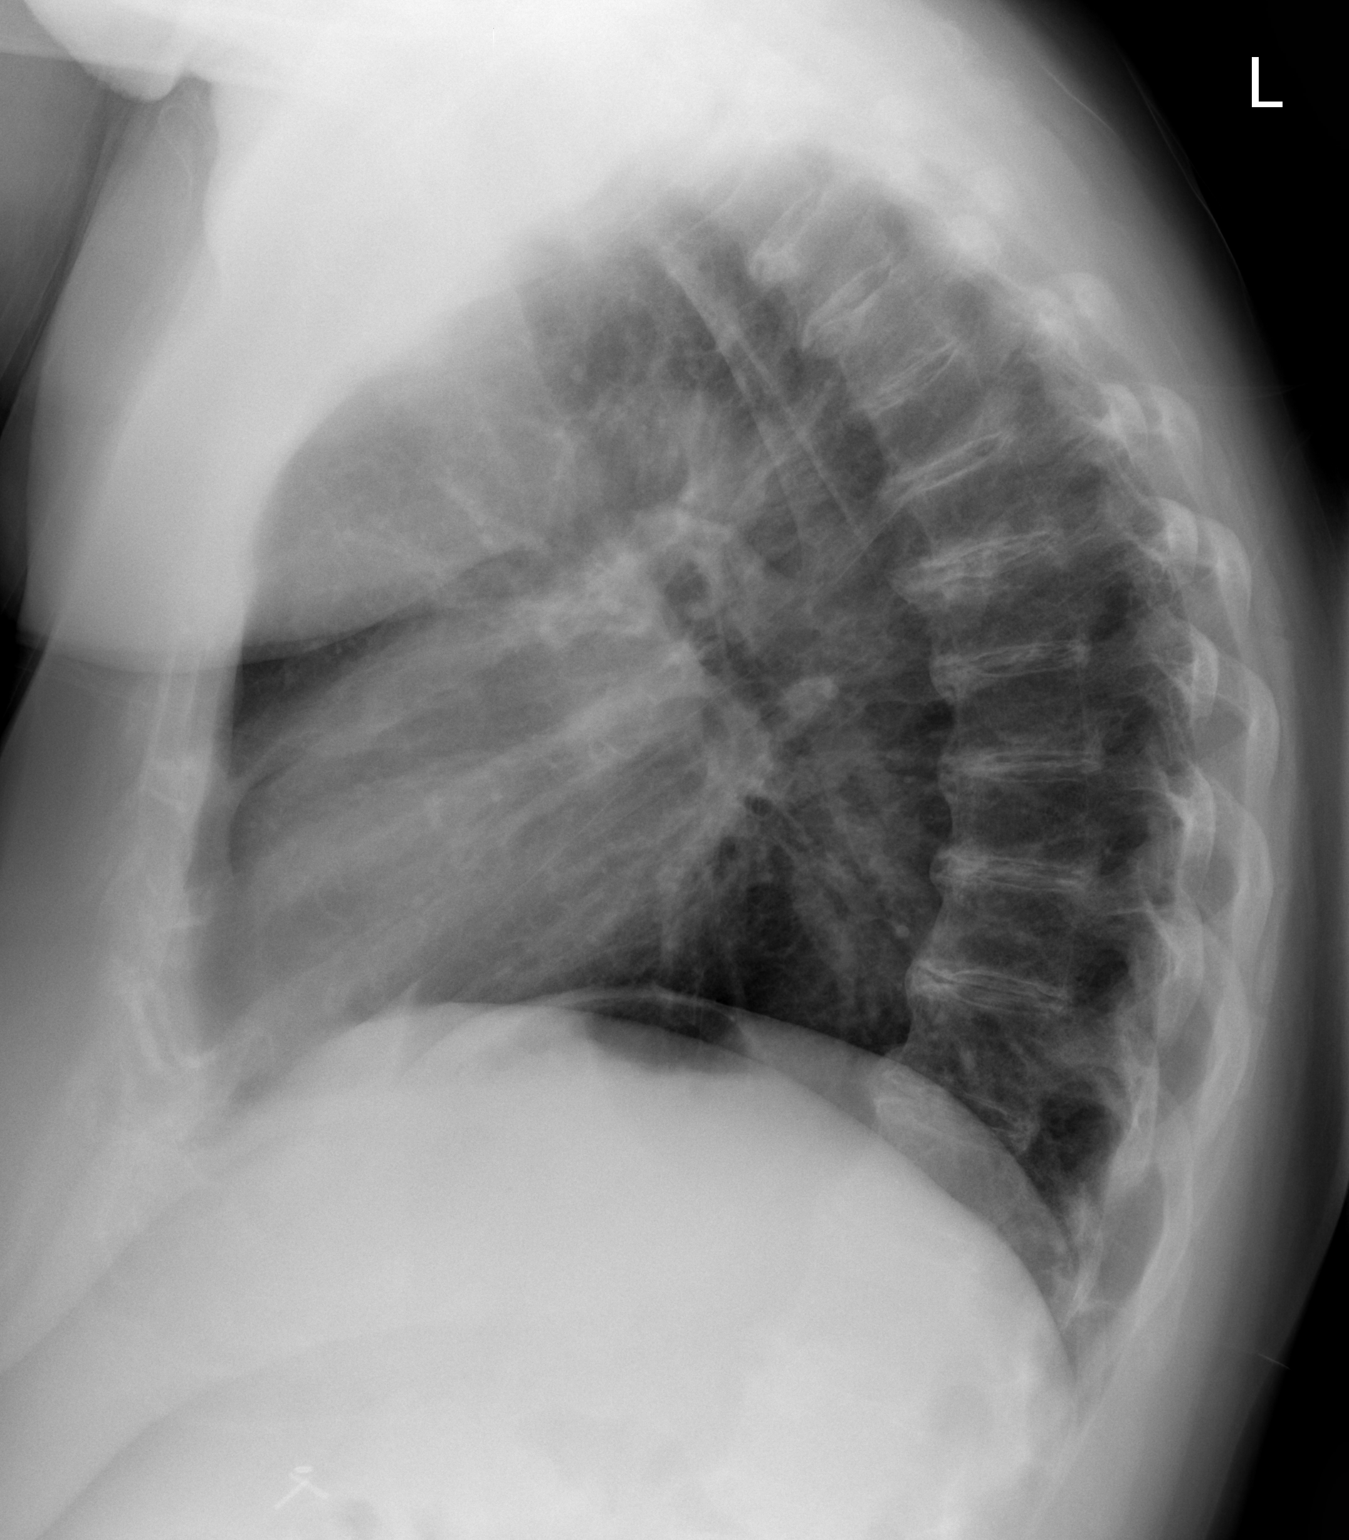

[2 of 2 positions shown; findings below may reference images not displayed]

FINDINGS: Opacity in the left midlung has improved since prior
study, likely improving pneumonia.  Heart is normal size.  Right
lung is clear.  No effusions.  Degenerative changes in the thoracic
spine.
IMPRESSION: Improving lingular pneumonia.

## 2012-12-17 ENCOUNTER — Emergency Department: Payer: Self-pay | Admitting: Emergency Medicine

## 2014-05-08 ENCOUNTER — Emergency Department
Admit: 2014-05-08 | Disposition: A | Discharge status: Home / Self Care | Payer: Self-pay | Admitting: Emergency Medicine

## 2014-05-08 LAB — URINALYSIS, COMPLETE
BILIRUBIN, UR: NEGATIVE
Glucose,UR: NEGATIVE mg/dL (ref 0–75)
Ketone: NEGATIVE
Leukocyte Esterase: NEGATIVE
NITRITE: NEGATIVE
PROTEIN: NEGATIVE
Ph: 7 (ref 4.5–8.0)
SPECIFIC GRAVITY: 1.011 (ref 1.003–1.030)

## 2014-05-08 LAB — COMPREHENSIVE METABOLIC PANEL
ALBUMIN: 4 g/dL
ALK PHOS: 58 U/L
ALT: 24 U/L
ANION GAP: 10 (ref 7–16)
BUN: 17 mg/dL
Bilirubin,Total: 0.7 mg/dL
CO2: 25 mmol/L
CREATININE: 0.66 mg/dL
Calcium, Total: 8.8 mg/dL — ABNORMAL LOW
Chloride: 100 mmol/L — ABNORMAL LOW
Glucose: 170 mg/dL — ABNORMAL HIGH
POTASSIUM: 4.2 mmol/L
SGOT(AST): 26 U/L
SODIUM: 135 mmol/L
Total Protein: 7.5 g/dL

## 2014-05-08 LAB — CBC WITH DIFFERENTIAL/PLATELET
BASOS PCT: 0.5 %
Basophil #: 0.1 10*3/uL (ref 0.0–0.1)
EOS ABS: 0.1 10*3/uL (ref 0.0–0.7)
EOS PCT: 0.5 %
HCT: 40.6 % (ref 35.0–47.0)
HGB: 13.2 g/dL (ref 12.0–16.0)
LYMPHS PCT: 7.2 %
Lymphocyte #: 0.9 10*3/uL — ABNORMAL LOW (ref 1.0–3.6)
MCH: 28.6 pg (ref 26.0–34.0)
MCHC: 32.6 g/dL (ref 32.0–36.0)
MCV: 88 fL (ref 80–100)
MONO ABS: 0.8 x10 3/mm (ref 0.2–0.9)
Monocyte %: 5.8 %
NEUTROS PCT: 86 %
Neutrophil #: 11.3 10*3/uL — ABNORMAL HIGH (ref 1.4–6.5)
PLATELETS: 264 10*3/uL (ref 150–440)
RBC: 4.63 10*6/uL (ref 3.80–5.20)
RDW: 13.3 % (ref 11.5–14.5)
WBC: 13.2 10*3/uL — ABNORMAL HIGH (ref 3.6–11.0)

## 2014-05-08 LAB — TROPONIN I

## 2014-05-08 LAB — LIPASE, BLOOD: LIPASE: 51 U/L

## 2015-01-12 ENCOUNTER — Encounter: Payer: Self-pay | Admitting: Emergency Medicine

## 2015-01-12 ENCOUNTER — Emergency Department: Payer: Medicare Other

## 2015-01-12 ENCOUNTER — Emergency Department
Admission: EM | Admit: 2015-01-12 | Discharge: 2015-01-13 | Disposition: A | Discharge status: Home / Self Care | Payer: Medicare Other | Attending: Emergency Medicine | Admitting: Emergency Medicine

## 2015-01-12 DIAGNOSIS — Z79890 Hormone replacement therapy (postmenopausal): Secondary | ICD-10-CM | POA: Diagnosis not present

## 2015-01-12 DIAGNOSIS — Z79899 Other long term (current) drug therapy: Secondary | ICD-10-CM | POA: Insufficient documentation

## 2015-01-12 DIAGNOSIS — R079 Chest pain, unspecified: Secondary | ICD-10-CM | POA: Diagnosis present

## 2015-01-12 DIAGNOSIS — I10 Essential (primary) hypertension: Secondary | ICD-10-CM | POA: Diagnosis not present

## 2015-01-12 DIAGNOSIS — R600 Localized edema: Secondary | ICD-10-CM | POA: Diagnosis not present

## 2015-01-12 DIAGNOSIS — Z794 Long term (current) use of insulin: Secondary | ICD-10-CM | POA: Diagnosis not present

## 2015-01-12 DIAGNOSIS — E119 Type 2 diabetes mellitus without complications: Secondary | ICD-10-CM | POA: Insufficient documentation

## 2015-01-12 DIAGNOSIS — Z7984 Long term (current) use of oral hypoglycemic drugs: Secondary | ICD-10-CM | POA: Diagnosis not present

## 2015-01-12 DIAGNOSIS — R0602 Shortness of breath: Secondary | ICD-10-CM | POA: Diagnosis not present

## 2015-01-12 DIAGNOSIS — R0600 Dyspnea, unspecified: Secondary | ICD-10-CM | POA: Diagnosis not present

## 2015-01-12 LAB — COMPREHENSIVE METABOLIC PANEL
ALT: 23 U/L (ref 14–54)
AST: 19 U/L (ref 15–41)
Albumin: 4.1 g/dL (ref 3.5–5.0)
Alkaline Phosphatase: 54 U/L (ref 38–126)
Anion gap: 7 (ref 5–15)
BUN: 21 mg/dL — ABNORMAL HIGH (ref 6–20)
CHLORIDE: 103 mmol/L (ref 101–111)
CO2: 29 mmol/L (ref 22–32)
CREATININE: 0.76 mg/dL (ref 0.44–1.00)
Calcium: 9 mg/dL (ref 8.9–10.3)
Glucose, Bld: 209 mg/dL — ABNORMAL HIGH (ref 65–99)
Potassium: 3.8 mmol/L (ref 3.5–5.1)
Sodium: 139 mmol/L (ref 135–145)
TOTAL PROTEIN: 7.5 g/dL (ref 6.5–8.1)
Total Bilirubin: 0.6 mg/dL (ref 0.3–1.2)

## 2015-01-12 LAB — CBC
HCT: 39 % (ref 35.0–47.0)
Hemoglobin: 12.7 g/dL (ref 12.0–16.0)
MCH: 28.8 pg (ref 26.0–34.0)
MCHC: 32.6 g/dL (ref 32.0–36.0)
MCV: 88.3 fL (ref 80.0–100.0)
PLATELETS: 231 10*3/uL (ref 150–440)
RBC: 4.42 MIL/uL (ref 3.80–5.20)
RDW: 13.7 % (ref 11.5–14.5)
WBC: 9 10*3/uL (ref 3.6–11.0)

## 2015-01-12 LAB — TROPONIN I

## 2015-01-12 LAB — BRAIN NATRIURETIC PEPTIDE: B NATRIURETIC PEPTIDE 5: 16 pg/mL (ref 0.0–100.0)

## 2015-01-12 MED ORDER — ASPIRIN 81 MG PO CHEW
324.0000 mg | CHEWABLE_TABLET | Freq: Once | ORAL | Status: AC
  Administered 2015-01-12: 324 mg via ORAL
  Filled 2015-01-12: qty 4

## 2015-01-12 NOTE — ED Provider Notes (Signed)
[facility] Emergency Department Provider Note  ____________________________________________  Time seen: Approximately 11:05 PM  I have reviewed the triage vital signs and the nursing notes.   HISTORY  Chief Complaint Chest Pain; Shortness of Breath; and Leg Swelling    HPI Briana Scott is a 64 y.o. female with a history of hypertension and diabetes who is presenting tonight with on-and-off chest pain over the past 1-2 days. She describes the pain as a pressure-like sensation. It starts in the mid sternum and that she says it radiates to both her left arm and her right arm. She says that she also has intermittent shortness of breath with exertion but the chest pain can come and go at random. She says the chest pain does not worsen with exertion but does come on with stress. She has had significant family stressors lately with issues with her son that she says may be the cause of this chest pain. She also says that she feels that she needs to belch when she has the chest pain and struck Maalox which improved her pain. She is also saying now that the shortness of breath is a chronic issue which is double for some time and has been related to her leg swelling. She has bilateral lower summary swelling with the left leg being worse than the right. She's had a previous surgery years ago on the left leg for meniscus issue and says that after that the leg started swelling. She says she has been on HCTZ chronically for this issue. Denies the leg swelling being any recent new issue. Denies any chest pain with deep breathing. Says she has stress test this past July the [place] which was negative. She's never had a cardiac catheterization. Says her mother is a history of congestive heart failure but does not have any stents and has never had a heart attack. Says the amount of swelling in her legs now is not any different than she has had in the past for the past several years. She denies any  chest pain or shortness of breath at this time.says the pain last for several minutes at a time.   Past Medical History  Diagnosis Date  . Diabetes mellitus   . Hypertension   . Mitral valve prolapse     Patient Active Problem List   Diagnosis Date Noted  . DIZZINESS 08/16/2009  . CHEST PAIN 08/16/2009  . HYPOTHYROIDISM 08/12/2009  . DM 08/12/2009  . HYPERTENSION 08/12/2009  . MITRAL VALVE PROLAPSE 08/12/2009    Past Surgical History  Procedure Laterality Date  . Abdominal hysterectomy    . Tonsillectomy    . Knee surgery      Current Outpatient Rx  Name  Route  Sig  Dispense  Refill  . ALPRAZolam (XANAX) 0.5 MG tablet   Oral   Take 0.5 mg by mouth 3 (three) times daily as needed. For anxiety/sleep         . atorvastatin (LIPITOR) 20 MG tablet   Oral   Take 20 mg by mouth at bedtime.         . conjugated estrogens (PREMARIN) vaginal cream   Vaginal   Place 1 g vaginally 3 (three) times a week.         . hydrochlorothiazide (HYDRODIURIL) 25 MG tablet   Oral   Take 25 mg by mouth daily.          Marland Kitchen HYDROcodone-acetaminophen (NORCO/VICODIN) 5-325 MG per tablet   Oral  Take 1 tablet by mouth every 6 (six) hours as needed. For pain         . HYDROcodone-acetaminophen (NORCO/VICODIN) 5-325 MG per tablet   Oral   Take 1-2 tablets by mouth every 6 (six) hours as needed for pain.   20 tablet   0   . insulin aspart protamine-insulin aspart (NOVOLOG 70/30) (70-30) 100 UNIT/ML injection   Subcutaneous   Inject 36-76 Units into the skin 2 (two) times daily with a meal. Takes 76 units in the am and 36 units in the pm         . levothyroxine (SYNTHROID, LEVOTHROID) 75 MCG tablet   Oral   Take 75 mcg by mouth daily.           Marland Kitchen. loratadine (CLARITIN) 10 MG tablet   Oral   Take 10 mg by mouth daily as needed. For sinus drainage         . metFORMIN (GLUCOPHAGE) 1000 MG tablet   Oral   Take 1,000 mg by mouth 2 (two) times daily with a meal.            . Naproxen Sodium (ALEVE) 220 MG CAPS   Oral   Take 2 capsules by mouth daily as needed (pain).         . Omega-3 Fatty Acids (FISH OIL) 1000 MG CAPS   Oral   Take 1 capsule by mouth 2 (two) times daily.          Marland Kitchen. oxybutynin (DITROPAN-XL) 10 MG 24 hr tablet   Oral   Take 10 mg by mouth daily.         . Polyvinyl Alcohol-Povidone (REFRESH OP)   Both Eyes   Place 2 drops into both eyes as needed (for dry eyes).         . traMADol (ULTRAM) 50 MG tablet   Oral   Take 1 tablet (50 mg total) by mouth every 6 (six) hours as needed for pain.   15 tablet   0   . verapamil (CALAN-SR) 180 MG CR tablet   Oral   Take 180 mg by mouth 2 (two) times daily.            Allergies Hydroxyzine pamoate; Oxycodone-acetaminophen; Prochlorperazine edisylate; Chlorpromazine hcl; Codeine; and Propoxyphene n-acetaminophen  History reviewed. No pertinent family history.  Social History Social History  Substance Use Topics  . Smoking status: Never Smoker   . Smokeless tobacco: None  . Alcohol Use: No    Review of Systems Constitutional: No fever/chills Eyes: No visual changes. ENT: No sore throat. Cardiovascular: as above Respiratory: as above Gastrointestinal: No abdominal pain.  No nausea, no vomiting.  No diarrhea.  No constipation. Genitourinary: Negative for dysuria. Musculoskeletal: Negative for back pain. Skin: Negative for rash. Neurological: Negative for headaches, focal weakness or numbness.  10-point ROS otherwise negative.  ____________________________________________   PHYSICAL EXAM:  VITAL SIGNS: ED Triage Vitals  Enc Vitals Group     BP 01/12/15 2113 148/91 mmHg     Pulse Rate 01/12/15 2113 83     Resp 01/12/15 2113 18     Temp 01/12/15 2113 97.6 F (36.4 C)     Temp Source 01/12/15 2113 Oral     SpO2 01/12/15 2113 100 %     Weight 01/12/15 2113 231 lb (104.781 kg)     Height 01/12/15 2113 5\' 8"  (1.727 m)     Head Cir --      Peak Flow --  Pain Score 01/12/15 2114 5     Pain Loc --      Pain Edu? --      Excl. in GC? --     Constitutional: Alert and oriented. Well appearing and in no acute distress. Eyes: Conjunctivae are normal. PERRL. EOMI. Head: Atraumatic. Nose: No congestion/rhinnorhea. Mouth/Throat: Mucous membranes are moist.  Oropharynx non-erythematous. Neck: No stridor.   Cardiovascular: Normal rate, regular rhythm. Grossly normal heart sounds.  Good peripheral circulation. Respiratory: Normal respiratory effort.  No retractions. Lungs CTAB. Gastrointestinal: Soft and nontender. No distention. No abdominal bruits. No CVA tenderness. Musculoskeletal: mild-to-moderate lower extremity edema which is bilateral with the left being somewhat larger than the right.  No joint effusions. Neurologic:  Normal speech and language. No gross focal neurologic deficits are appreciated. No gait instability. Skin:  Skin is warm, dry and intact. No rash noted. Psychiatric: Mood and affect are normal. Speech and behavior are normal.  ____________________________________________   LABS (all labs ordered are listed, but only abnormal results are displayed)  Labs Reviewed  COMPREHENSIVE METABOLIC PANEL - Abnormal; Notable for the following:    Glucose, Bld 209 (*)    BUN 21 (*)    All other components within normal limits  CBC  TROPONIN I  BRAIN NATRIURETIC PEPTIDE  TROPONIN I   ____________________________________________  EKG  ED ECG REPORT I, Arelia Longest, the attending physician, personally viewed and interpreted this ECG.   Date: 01/12/2015  EKG Time: 2114  Rate: 79  Rhythm: normal sinus rhythm  Axis: normal  Intervals:none  ST&T Change: no ST segment elevation or depression. No abnormal T-wave inversions.  ED ECG REPORT I, Arelia Longest, the attending physician, personally viewed and interpreted this ECG.   Date: 01/13/2015  EKG Time: 2 AM  Rate: 76  Rhythm: normal sinus rhythm  Axis:  normal axis  Intervals:none  ST&T Change: no ST segment elevation or depression. T-wave inversion in lead V2 but I suspect this is likely secondary to lead placementsince this EKG is now done a different machine.. Patient continues without chest pain.  ____________________________________________  RADIOLOGY  No acute disease on the chest x-ray. I personally reviewed these films. ____________________________________________   PROCEDURES    ____________________________________________   INITIAL IMPRESSION / ASSESSMENT AND PLAN / ED COURSE  Pertinent labs & imaging results that were available during my care of the patient were reviewed by me and considered in my medical decision making (see chart for details).  He was several risk factors for coronary disease but reassuring factors include that she had a negative stress test in the past does not a family history. She also says that she does not smoke. Do not suspect PE. Pain is intermittent and the patient does not have any shortness of breath and tachycardia or hypoxia. Also, the leg swelling is chronic and unchanged. Plan to repeat her troponin. We'll give aspirin. Likely will follow-up at the [place] if second troponin is negative and patient continues to be comfortable without chest pain.  ----------------------------------------- 2:34 AM on 01/13/2015 -----------------------------------------  Patient remains chest pain-free and also with a second reassuring EKG as well as a second negative troponin. Will be discharged home. Patient knows to call her doctor at the [place] in the morning. No she may return at any point for any worsening or concerning symptoms. Also told patient to please continue use Maalox as needed as this relieved her pain prior to arrival. Knows that she muscular to follow-up with her  primary care doctor within the next 72 hours ideally. She understands the plan is one to comply.there also may be an anxiety component to this  as the patient says that she is under considerable stress at home due to family situations. She did not report any suicidal or homicidal ideation. ____________________________________________   FINAL CLINICAL IMPRESSION(S) / ED DIAGNOSES  Chest pain. Shortness of breath.    Myrna Blazer, MD 01/13/15 [phone]

## 2015-01-12 NOTE — ED Notes (Signed)
Pt arrived to the ED for complaints of chest pain, shortness of breath and leg swelling. Pt reports that the chest pain moves everywhere in the chest and that she took Malox and it made it somewhat better. Pt also reports that she is seen in [facility] and they "tested her heart and it was good." Pt reports that she suffers of anxiety attacks but has not had one in a long time. Pt is AOx4 in no apparent distress.

## 2015-01-13 LAB — TROPONIN I: Troponin I: <0.03 ng/mL (ref <0.031)

## 2015-01-13 NOTE — ED Notes (Signed)
Patient with no complaints at this time. Respirations even and unlabored. Skin warm/dry. Discharge instructions reviewed with patient at this time. Patient given opportunity to voice concerns/ask questions. Patient discharged at this time and left Emergency Department with steady gait.   

## 2015-01-13 NOTE — ED Notes (Signed)
MD at bedside, updating patient on discharge disposition.

## 2015-10-18 ENCOUNTER — Emergency Department
Admission: EM | Admit: 2015-10-18 | Discharge: 2015-10-18 | Disposition: A | Discharge status: Home / Self Care | Payer: Medicare Other | Attending: Emergency Medicine | Admitting: Emergency Medicine

## 2015-10-18 ENCOUNTER — Emergency Department: Payer: Medicare Other

## 2015-10-18 ENCOUNTER — Encounter: Payer: Self-pay | Admitting: Emergency Medicine

## 2015-10-18 DIAGNOSIS — W010XXA Fall on same level from slipping, tripping and stumbling without subsequent striking against object, initial encounter: Secondary | ICD-10-CM | POA: Diagnosis not present

## 2015-10-18 DIAGNOSIS — S79912A Unspecified injury of left hip, initial encounter: Secondary | ICD-10-CM | POA: Diagnosis present

## 2015-10-18 DIAGNOSIS — Y9289 Other specified places as the place of occurrence of the external cause: Secondary | ICD-10-CM | POA: Diagnosis not present

## 2015-10-18 DIAGNOSIS — E039 Hypothyroidism, unspecified: Secondary | ICD-10-CM | POA: Diagnosis not present

## 2015-10-18 DIAGNOSIS — Y939 Activity, unspecified: Secondary | ICD-10-CM | POA: Diagnosis not present

## 2015-10-18 DIAGNOSIS — Z79899 Other long term (current) drug therapy: Secondary | ICD-10-CM | POA: Insufficient documentation

## 2015-10-18 DIAGNOSIS — E119 Type 2 diabetes mellitus without complications: Secondary | ICD-10-CM | POA: Insufficient documentation

## 2015-10-18 DIAGNOSIS — S76012A Strain of muscle, fascia and tendon of left hip, initial encounter: Secondary | ICD-10-CM | POA: Diagnosis not present

## 2015-10-18 DIAGNOSIS — Y999 Unspecified external cause status: Secondary | ICD-10-CM | POA: Insufficient documentation

## 2015-10-18 DIAGNOSIS — Z794 Long term (current) use of insulin: Secondary | ICD-10-CM | POA: Diagnosis not present

## 2015-10-18 DIAGNOSIS — I10 Essential (primary) hypertension: Secondary | ICD-10-CM | POA: Insufficient documentation

## 2015-10-18 DIAGNOSIS — W19XXXA Unspecified fall, initial encounter: Secondary | ICD-10-CM

## 2015-10-18 MED ORDER — TRAMADOL HCL 50 MG PO TABS: 50.0000 mg | ORAL_TABLET | Freq: Four times a day (QID) | ORAL | 0 refills | Status: AC | PRN

## 2015-10-18 NOTE — ED Triage Notes (Signed)
Tripped and fell 2 days ago (fell onto grass). C/O left groin pain that radiates down left leg.

## 2015-10-18 NOTE — ED Provider Notes (Signed)
[facility] Emergency Department Provider Note   ____________________________________________   First MD Initiated Contact with Patient 10/18/15 1603     (approximate)  I have reviewed the triage vital signs and the nursing notes.   HISTORY  Chief Complaint Fall (left leg pain)    HPI Briana Scott is a 65 y.o. female who presents for ongoing 2 days ago and injuring her left hip and pelvis. Patient states her foot got caught in a flower bed and she tripped. Describes her pain as 10 over 10 nonradiating. Patient currently takes Vicodin 5 mg 3 times a day prescribed by her PCP.   Past Medical History:  Diagnosis Date  . Diabetes mellitus   . Hypertension   . Mitral valve prolapse     Patient Active Problem List   Diagnosis Date Noted  . DIZZINESS 08/16/2009  . CHEST PAIN 08/16/2009  . HYPOTHYROIDISM 08/12/2009  . DM 08/12/2009  . HYPERTENSION 08/12/2009  . MITRAL VALVE PROLAPSE 08/12/2009    Past Surgical History:  Procedure Laterality Date  . ABDOMINAL HYSTERECTOMY    . KNEE SURGERY    . TONSILLECTOMY      Prior to Admission medications   Medication Sig Start Date End Date Taking? Authorizing Provider  ALPRAZolam Prudy Feeler) 0.5 MG tablet Take 0.5 mg by mouth 3 (three) times daily as needed. For anxiety/sleep    Historical Provider, MD  atorvastatin (LIPITOR) 20 MG tablet Take 20 mg by mouth at bedtime.    Historical Provider, MD  conjugated estrogens (PREMARIN) vaginal cream Place 1 g vaginally 3 (three) times a week.    Historical Provider, MD  hydrochlorothiazide (HYDRODIURIL) 25 MG tablet Take 25 mg by mouth daily.     Historical Provider, MD  HYDROcodone-acetaminophen (NORCO/VICODIN) 5-325 MG per tablet Take 1 tablet by mouth every 6 (six) hours as needed. For pain    Historical Provider, MD  HYDROcodone-acetaminophen (NORCO/VICODIN) 5-325 MG per tablet Take 1-2 tablets by mouth every 6 (six) hours as needed for pain. 03/16/12   Cathren Laine, MD  insulin aspart protamine-insulin aspart (NOVOLOG 70/30) (70-30) 100 UNIT/ML injection Inject 36-76 Units into the skin 2 (two) times daily with a meal. Takes 76 units in the am and 36 units in the pm    Historical Provider, MD  levothyroxine (SYNTHROID, LEVOTHROID) 75 MCG tablet Take 75 mcg by mouth daily.      Historical Provider, MD  loratadine (CLARITIN) 10 MG tablet Take 10 mg by mouth daily as needed. For sinus drainage    Historical Provider, MD  metFORMIN (GLUCOPHAGE) 1000 MG tablet Take 1,000 mg by mouth 2 (two) times daily with a meal.      Historical Provider, MD  Naproxen Sodium (ALEVE) 220 MG CAPS Take 2 capsules by mouth daily as needed (pain).    Historical Provider, MD  Omega-3 Fatty Acids (FISH OIL) 1000 MG CAPS Take 1 capsule by mouth 2 (two) times daily.     Historical Provider, MD  oxybutynin (DITROPAN-XL) 10 MG 24 hr tablet Take 10 mg by mouth daily.    Historical Provider, MD  Polyvinyl Alcohol-Povidone (REFRESH OP) Place 2 drops into both eyes as needed (for dry eyes).    Historical Provider, MD  traMADol (ULTRAM) 50 MG tablet Take 1 tablet (50 mg total) by mouth every 6 (six) hours as needed. 10/18/15 10/17/16  Charmayne Sheer Jontrell Bushong, PA-C  verapamil (CALAN-SR) 180 MG CR tablet Take 180 mg by mouth 2 (two) times daily.  Historical Provider, MD    Allergies Hydroxyzine pamoate; Oxycodone-acetaminophen; Prochlorperazine edisylate; Chlorpromazine hcl; Codeine; and Propoxyphene n-acetaminophen  No family history on file.  Social History Social History  Substance Use Topics  . Smoking status: Never Smoker  . Smokeless tobacco: Never Used  . Alcohol use No    Review of Systems Constitutional: No fever/chills Gastrointestinal: No abdominal pain.  No nausea, no vomiting.  No diarrhea.  No constipation. Genitourinary: Negative for dysuria. Musculoskeletal: Positive for left hip and pelvis pain. Skin: Negative for rash. Neurological: Negative for headaches, focal  weakness or numbness.  10-point ROS otherwise negative.  ____________________________________________   PHYSICAL EXAM:  VITAL SIGNS: ED Triage Vitals  Enc Vitals Group     BP 10/18/15 1523 (!) 146/70     Pulse --      Resp 10/18/15 1523 16     Temp 10/18/15 1523 98 F (36.7 C)     Temp Source 10/18/15 1523 Oral     SpO2 10/18/15 1523 97 %     Weight 10/18/15 1521 215 lb (97.5 kg)     Height 10/18/15 1521 5\' 8"  (1.727 m)     Head Circumference --      Peak Flow --      Pain Score 10/18/15 1522 9     Pain Loc --      Pain Edu? --      Excl. in GC? --     Constitutional: Alert and oriented. Well appearing and in no acute distress. Cardiovascular: Normal rate, regular rhythm. Grossly normal heart sounds.  Good peripheral circulation. Respiratory: Normal respiratory effort.  No retractions. Lungs CTAB. Musculoskeletal: Point tenderness to the left pelvis and groin area. No ecchymosis or bruising. Distally neurovascularly intact. Increased pain with extension of the hip. Neurologic:  Normal speech and language. No gross focal neurologic deficits are appreciated. Skin:  Skin is warm, dry and intact. No rash noted. Psychiatric: Mood and affect are normal. Speech and behavior are normal.  ____________________________________________   LABS (all labs ordered are listed, but only abnormal results are displayed)  Labs Reviewed - No data to display ____________________________________________  EKG   ____________________________________________  RADIOLOGY  IMPRESSION:  No fracture or dislocation. Mild symmetric narrowing of both hip  joints.    ____________________________________________   PROCEDURES  Procedure(s) performed: None  Procedures  Critical Care performed: No  ____________________________________________   INITIAL IMPRESSION / ASSESSMENT AND PLAN / ED COURSE  Pertinent labs & imaging results that were available during my care of the patient were  reviewed by me and considered in my medical decision making (see chart for details).  Acute left hip strain. Rx given for tramadol 50 mg. Patient follow-up with her PCP next week as scheduled. Return to ER with any worsening symptomology.  Clinical Course     ____________________________________________   FINAL CLINICAL IMPRESSION(S) / ED DIAGNOSES  Final diagnoses:  Fall, initial encounter  Hip strain, left, initial encounter      NEW MEDICATIONS STARTED DURING THIS VISIT:  New Prescriptions   TRAMADOL (ULTRAM) 50 MG TABLET    Take 1 tablet (50 mg total) by mouth every 6 (six) hours as needed.     Note:  This document was prepared using Dragon voice recognition software and may include unintentional dictation errors.   Evangeline Dakinharles M Jonathan Corpus, PA-C 10/18/15 1715    Rockne MenghiniAnne-Caroline Norman, MD 10/18/15 1929

## 2015-11-08 ENCOUNTER — Emergency Department: Payer: Medicare Other

## 2015-11-08 ENCOUNTER — Emergency Department
Admission: EM | Admit: 2015-11-08 | Discharge: 2015-11-08 | Disposition: A | Discharge status: Home / Self Care | Payer: Medicare Other | Attending: Emergency Medicine | Admitting: Emergency Medicine

## 2015-11-08 ENCOUNTER — Encounter: Payer: Self-pay | Admitting: Emergency Medicine

## 2015-11-08 DIAGNOSIS — Z79899 Other long term (current) drug therapy: Secondary | ICD-10-CM | POA: Diagnosis not present

## 2015-11-08 DIAGNOSIS — Z7984 Long term (current) use of oral hypoglycemic drugs: Secondary | ICD-10-CM | POA: Insufficient documentation

## 2015-11-08 DIAGNOSIS — Z794 Long term (current) use of insulin: Secondary | ICD-10-CM | POA: Diagnosis not present

## 2015-11-08 DIAGNOSIS — N3090 Cystitis, unspecified without hematuria: Secondary | ICD-10-CM | POA: Insufficient documentation

## 2015-11-08 DIAGNOSIS — M791 Myalgia, unspecified site: Secondary | ICD-10-CM

## 2015-11-08 DIAGNOSIS — E039 Hypothyroidism, unspecified: Secondary | ICD-10-CM | POA: Insufficient documentation

## 2015-11-08 DIAGNOSIS — I10 Essential (primary) hypertension: Secondary | ICD-10-CM | POA: Diagnosis not present

## 2015-11-08 DIAGNOSIS — E119 Type 2 diabetes mellitus without complications: Secondary | ICD-10-CM | POA: Diagnosis not present

## 2015-11-08 DIAGNOSIS — M25511 Pain in right shoulder: Secondary | ICD-10-CM | POA: Diagnosis present

## 2015-11-08 LAB — URINALYSIS COMPLETE WITH MICROSCOPIC (ARMC ONLY)
Bilirubin Urine: NEGATIVE
Glucose, UA: NEGATIVE mg/dL
Ketones, ur: NEGATIVE mg/dL
Nitrite: NEGATIVE
Protein, ur: NEGATIVE mg/dL
SPECIFIC GRAVITY, URINE: 1.005 (ref 1.005–1.030)
pH: 6 (ref 5.0–8.0)

## 2015-11-08 LAB — TROPONIN I: Troponin I: <0.03 ng/mL (ref <0.03)

## 2015-11-08 LAB — CBC
HCT: 40.3 % (ref 35.0–47.0)
Hemoglobin: 13.5 g/dL (ref 12.0–16.0)
MCH: 29.8 pg (ref 26.0–34.0)
MCHC: 33.4 g/dL (ref 32.0–36.0)
MCV: 89.3 fL (ref 80.0–100.0)
PLATELETS: 248 10*3/uL (ref 150–440)
RBC: 4.51 MIL/uL (ref 3.80–5.20)
RDW: 13.5 % (ref 11.5–14.5)
WBC: 11.4 10*3/uL — ABNORMAL HIGH (ref 3.6–11.0)

## 2015-11-08 LAB — BASIC METABOLIC PANEL
Anion gap: 13 (ref 5–15)
BUN: 22 mg/dL — ABNORMAL HIGH (ref 6–20)
CHLORIDE: 100 mmol/L — AB (ref 101–111)
CO2: 26 mmol/L (ref 22–32)
CREATININE: 0.84 mg/dL (ref 0.44–1.00)
Calcium: 9.9 mg/dL (ref 8.9–10.3)
GFR calc non Af Amer: >60 mL/min (ref >60)
Glucose, Bld: 100 mg/dL — ABNORMAL HIGH (ref 65–99)
Potassium: 3.6 mmol/L (ref 3.5–5.1)
Sodium: 139 mmol/L (ref 135–145)

## 2015-11-08 MED ORDER — CEPHALEXIN 500 MG PO CAPS: 500.0000 mg | ORAL_CAPSULE | Freq: Two times a day (BID) | ORAL | 0 refills | Status: DC

## 2015-11-08 MED ORDER — CEPHALEXIN 500 MG PO CAPS
500.0000 mg | ORAL_CAPSULE | Freq: Once | ORAL | Status: AC
  Administered 2015-11-08: 500 mg via ORAL
  Filled 2015-11-08 (×2): qty 1

## 2015-11-08 NOTE — ED Provider Notes (Signed)
[facility] Emergency Department Provider Note  ____________________________________________  Time seen: Approximately 7:04 PM  I have reviewed the triage vital signs and the nursing notes.   HISTORY  Chief Complaint Arm Pain; Torticollis; and Chest Pain    HPI Briana Scott is a 65 y.o. female who reports bilateral hand arm and shoulder pain for the past 4 days, constant but waxing and waning, aching. Worse with movement and change in position. Not exertional, not pleuritic. No associated shortness of breath vomiting or diaphoresis. No dizziness or syncope. No recent trauma.  She does endorse myalgias as well. No changes in medications recently. Positive nasal congestion. Positive sore throat  Patient specifically denies any chest pain whatsoever.   Past Medical History:  Diagnosis Date  . Diabetes mellitus   . Hypertension   . Mitral valve prolapse      Patient Active Problem List   Diagnosis Date Noted  . DIZZINESS 08/16/2009  . CHEST PAIN 08/16/2009  . HYPOTHYROIDISM 08/12/2009  . DM 08/12/2009  . HYPERTENSION 08/12/2009  . MITRAL VALVE PROLAPSE 08/12/2009     Past Surgical History:  Procedure Laterality Date  . ABDOMINAL HYSTERECTOMY    . KNEE SURGERY    . TONSILLECTOMY       Prior to Admission medications   Medication Sig Start Date End Date Taking? Authorizing Provider  ALPRAZolam Prudy Feeler) 0.5 MG tablet Take 0.5 mg by mouth 3 (three) times daily as needed. For anxiety/sleep    Historical Provider, MD  atorvastatin (LIPITOR) 20 MG tablet Take 20 mg by mouth at bedtime.    Historical Provider, MD  cephALEXin (KEFLEX) 500 MG capsule Take 1 capsule (500 mg total) by mouth 2 (two) times daily. 11/08/15   Sharman Cheek, MD  conjugated estrogens (PREMARIN) vaginal cream Place 1 g vaginally 3 (three) times a week.    Historical Provider, MD  hydrochlorothiazide (HYDRODIURIL) 25 MG tablet Take 25 mg by mouth daily.     Historical  Provider, MD  HYDROcodone-acetaminophen (NORCO/VICODIN) 5-325 MG per tablet Take 1 tablet by mouth every 6 (six) hours as needed. For pain    Historical Provider, MD  HYDROcodone-acetaminophen (NORCO/VICODIN) 5-325 MG per tablet Take 1-2 tablets by mouth every 6 (six) hours as needed for pain. 03/16/12   Cathren Laine, MD  insulin aspart protamine-insulin aspart (NOVOLOG 70/30) (70-30) 100 UNIT/ML injection Inject 36-76 Units into the skin 2 (two) times daily with a meal. Takes 76 units in the am and 36 units in the pm    Historical Provider, MD  levothyroxine (SYNTHROID, LEVOTHROID) 75 MCG tablet Take 75 mcg by mouth daily.      Historical Provider, MD  loratadine (CLARITIN) 10 MG tablet Take 10 mg by mouth daily as needed. For sinus drainage    Historical Provider, MD  metFORMIN (GLUCOPHAGE) 1000 MG tablet Take 1,000 mg by mouth 2 (two) times daily with a meal.      Historical Provider, MD  Naproxen Sodium (ALEVE) 220 MG CAPS Take 2 capsules by mouth daily as needed (pain).    Historical Provider, MD  Omega-3 Fatty Acids (FISH OIL) 1000 MG CAPS Take 1 capsule by mouth 2 (two) times daily.     Historical Provider, MD  oxybutynin (DITROPAN-XL) 10 MG 24 hr tablet Take 10 mg by mouth daily.    Historical Provider, MD  Polyvinyl Alcohol-Povidone (REFRESH OP) Place 2 drops into both eyes as needed (for dry eyes).    Historical Provider, MD  traMADol (ULTRAM) 50 MG  tablet Take 1 tablet (50 mg total) by mouth every 6 (six) hours as needed. 10/18/15 10/17/16  Charmayne Sheer Beers, PA-C  verapamil (CALAN-SR) 180 MG CR tablet Take 180 mg by mouth 2 (two) times daily.     Historical Provider, MD     Allergies Hydroxyzine pamoate; Oxycodone-acetaminophen; Prochlorperazine edisylate; Chlorpromazine hcl; Codeine; and Propoxyphene n-acetaminophen   No family history on file.  Social History Social History  Substance Use Topics  . Smoking status: Never Smoker  . Smokeless tobacco: Never Used  . Alcohol use No     Review of Systems  Constitutional:   No fever or chills.  ENT:   Positive sore throat and nasal congestion. Cardiovascular:   No chest pain. Respiratory:   No dyspnea or cough. Gastrointestinal:   Negative for abdominal pain, vomiting and diarrhea.  Genitourinary:   Positive dysuria. Musculoskeletal:   Positive bilateral upper extremity pain. Positive left flank pain. Neurological:   Negative for headaches 10-point ROS otherwise negative.  ____________________________________________   PHYSICAL EXAM:  VITAL SIGNS: ED Triage Vitals  Enc Vitals Group     BP 11/08/15 1811 107/64     Pulse Rate 11/08/15 1811 83     Resp 11/08/15 1811 18     Temp 11/08/15 1811 97.9 F (36.6 C)     Temp Source 11/08/15 1811 Oral     SpO2 11/08/15 1811 98 %     Weight 11/08/15 1812 228 lb (103.4 kg)     Height 11/08/15 1812 5\' 8"  (1.727 m)     Head Circumference --      Peak Flow --      Pain Score 11/08/15 1812 10     Pain Loc --      Pain Edu? --      Excl. in GC? --     Vital signs reviewed, nursing assessments reviewed.   Constitutional:   Alert and oriented. Well appearing and in no distress. Eyes:   No scleral icterus. No conjunctival pallor. PERRL. EOMI.  No nystagmus. ENT   Head:   Normocephalic and atraumatic.   Nose:   No congestion/rhinnorhea. No septal hematoma   Mouth/Throat:   MMM, no pharyngeal erythema. No peritonsillar mass.    Neck:   No stridor. No SubQ emphysema. No meningismus. Hematological/Lymphatic/Immunilogical:   No cervical lymphadenopathy. Cardiovascular:   RRR. Symmetric bilateral radial and DP pulses.  No murmurs.  Respiratory:   Normal respiratory effort without tachypnea nor retractions. Breath sounds are clear and equal bilaterally. No wheezes/rales/rhonchi. Gastrointestinal:   Soft and nontender. Non distended. There is no CVA tenderness.  No rebound, rigidity, or guarding. Genitourinary:   deferred Musculoskeletal:   Positive bilateral  shoulder tenderness reproduces her pain. Otherwise extremities and muscular skeletal unremarkable. Normal range of motion. No midline spinal tenderness. Neurologic: Normal speech and language.  CN 2-10 normal. Motor grossly intact. No gross focal neurologic deficits are appreciated.  Skin:    Skin is warm, dry and intact. No rash noted.  No petechiae, purpura, or bullae.  ____________________________________________    LABS (pertinent positives/negatives) (all labs ordered are listed, but only abnormal results are displayed) Labs Reviewed  BASIC METABOLIC PANEL - Abnormal; Notable for the following:       Result Value   Chloride 100 (*)    Glucose, Bld 100 (*)    BUN 22 (*)    All other components within normal limits  CBC - Abnormal; Notable for the following:    WBC 11.4 (*)  All other components within normal limits  URINALYSIS COMPLETEWITH MICROSCOPIC ([facility] ONLY) - Abnormal; Notable for the following:    Color, Urine STRAW (*)    APPearance CLEAR (*)    Hgb urine dipstick 2+ (*)    Leukocytes, UA 2+ (*)    Bacteria, UA RARE (*)    Squamous Epithelial / LPF 0-5 (*)    All other components within normal limits  URINE CULTURE  TROPONIN I   ____________________________________________   EKG  Interpreted by me Normal sinus rhythm rate of 82, normal axis and intervals. Poor R-wave progression in anterior precordial leads. Normal ST segments and T waves.  ____________________________________________    RADIOLOGY  Chest x-ray unremarkable  ____________________________________________   PROCEDURES Procedures  ____________________________________________   INITIAL IMPRESSION / ASSESSMENT AND PLAN / ED COURSE  Pertinent labs & imaging results that were available during my care of the patient were reviewed by me and considered in my medical decision making (see chart for details).  Patient resents with musculoskeletal shoulder pain bilaterally. Atypical  symptoms. Overall unifying theory is that this patient has a viral illness, possibly influenza-like illness. She is well-appearing no acute distress unremarkable vital signs per labs are unremarkable. Plan for outpatient follow-up, NSAIDs rest return precautions.     Clinical Course    ----------------------------------------- 7:49 PM on 11/08/2015 -----------------------------------------  Urinalysis reveals a clear urinary tract infection. No evidence of sepsis or pyelonephritis. Start on Keflex, follow-up primary care. Urine culture sent. ____________________________________________   FINAL CLINICAL IMPRESSION(S) / ED DIAGNOSES  Final diagnoses:  Myalgia  Cystitis       Portions of this note were generated with dragon dictation software. Dictation errors may occur despite best attempts at proofreading.    Sharman CheekPhillip Jayvan Mcshan, MD 11/08/15 [phone]

## 2015-11-08 NOTE — ED Triage Notes (Signed)
Pt with left arm and neck pain with chest pain since Friday.

## 2015-11-11 LAB — URINE CULTURE

## 2015-11-12 NOTE — Progress Notes (Signed)
65 y/o F d/c from ED 11/08/15 on cephalexin for cystitis. Urine cx back with E coli resistant to cefazolin. Dr. Dorothea GlassmanPaul Malinda authorized Macrobid 100 mg bid x 7 days and prescription was called into [facility] in [place] [facility] per patient request. Patient instructed to d/c cephalexin.   Luisa HartScott Cleavon Goldman, PharmD Clinical Pharmacist

## 2016-03-19 ENCOUNTER — Encounter: Payer: Self-pay | Admitting: *Deleted

## 2016-03-19 ENCOUNTER — Emergency Department
Admission: EM | Admit: 2016-03-19 | Discharge: 2016-03-19 | Disposition: A | Discharge status: Home / Self Care | Payer: Medicare Other | Attending: Emergency Medicine | Admitting: Emergency Medicine

## 2016-03-19 DIAGNOSIS — Z79899 Other long term (current) drug therapy: Secondary | ICD-10-CM | POA: Insufficient documentation

## 2016-03-19 DIAGNOSIS — M1712 Unilateral primary osteoarthritis, left knee: Secondary | ICD-10-CM | POA: Diagnosis not present

## 2016-03-19 DIAGNOSIS — E039 Hypothyroidism, unspecified: Secondary | ICD-10-CM | POA: Insufficient documentation

## 2016-03-19 DIAGNOSIS — I10 Essential (primary) hypertension: Secondary | ICD-10-CM | POA: Insufficient documentation

## 2016-03-19 DIAGNOSIS — M25562 Pain in left knee: Secondary | ICD-10-CM | POA: Diagnosis present

## 2016-03-19 DIAGNOSIS — E119 Type 2 diabetes mellitus without complications: Secondary | ICD-10-CM | POA: Diagnosis not present

## 2016-03-19 DIAGNOSIS — Z794 Long term (current) use of insulin: Secondary | ICD-10-CM | POA: Diagnosis not present

## 2016-03-19 MED ORDER — DICLOFENAC SODIUM 1 % TD GEL: 2.0000 g | Freq: Four times a day (QID) | TRANSDERMAL | 0 refills | Status: AC

## 2016-03-19 NOTE — ED Notes (Signed)

## 2016-03-19 NOTE — ED Provider Notes (Signed)
[facility]-EMERGENCY DEPARTMENT Provider Note   CSN: [id number] Arrival date & time: 03/19/16  1736     History   Chief Complaint Chief Complaint  Patient presents with  . Knee Pain    HPI Briana Scott is a 66 y.o. female presents to the emergency department for evaluation of left knee pain. She has a history of chronic left knee osteoarthritis in his currently seeing orthopedist receiving gel injections into the left knee every 6 months. She is also prescribed Norco 5 mg 3 times a day for her chronic pain. She needs a total knee arthroplasty but due to her hemoglobin A1c being elevated and obesity she has not been able to have her knee replaced. She denies any trauma or injury. No fevers warmth or redness. She's had increasing knee pain for the last couple weeks. Pain is located throughout the knee. She denies any groin pain she is able to ambulate with a cane. No numbness or tingling.  HPI  Past Medical History:  Diagnosis Date  . Diabetes mellitus   . Hypertension   . Mitral valve prolapse     Patient Active Problem List   Diagnosis Date Noted  . DIZZINESS 08/16/2009  . CHEST PAIN 08/16/2009  . HYPOTHYROIDISM 08/12/2009  . DM 08/12/2009  . HYPERTENSION 08/12/2009  . MITRAL VALVE PROLAPSE 08/12/2009    Past Surgical History:  Procedure Laterality Date  . ABDOMINAL HYSTERECTOMY    . KNEE SURGERY    . TONSILLECTOMY      OB History    No data available       Home Medications    Prior to Admission medications   Medication Sig Start Date End Date Taking? Authorizing Provider  ALPRAZolam Prudy Feeler) 0.5 MG tablet Take 0.5 mg by mouth 3 (three) times daily as needed. For anxiety/sleep    Historical Provider, MD  atorvastatin (LIPITOR) 20 MG tablet Take 20 mg by mouth at bedtime.    Historical Provider, MD  cephALEXin (KEFLEX) 500 MG capsule Take 1 capsule (500 mg total) by mouth 2 (two) times daily. 11/08/15   Sharman Cheek, MD  conjugated estrogens (PREMARIN)  vaginal cream Place 1 g vaginally 3 (three) times a week.    Historical Provider, MD  diclofenac sodium (VOLTAREN) 1 % GEL Apply 2 g topically 4 (four) times daily. 03/19/16   Evon Slack, PA-C  hydrochlorothiazide (HYDRODIURIL) 25 MG tablet Take 25 mg by mouth daily.     Historical Provider, MD  HYDROcodone-acetaminophen (NORCO/VICODIN) 5-325 MG per tablet Take 1 tablet by mouth every 6 (six) hours as needed. For pain    Historical Provider, MD  HYDROcodone-acetaminophen (NORCO/VICODIN) 5-325 MG per tablet Take 1-2 tablets by mouth every 6 (six) hours as needed for pain. 03/16/12   Cathren Laine, MD  insulin aspart protamine-insulin aspart (NOVOLOG 70/30) (70-30) 100 UNIT/ML injection Inject 36-76 Units into the skin 2 (two) times daily with a meal. Takes 76 units in the am and 36 units in the pm    Historical Provider, MD  levothyroxine (SYNTHROID, LEVOTHROID) 75 MCG tablet Take 75 mcg by mouth daily.      Historical Provider, MD  loratadine (CLARITIN) 10 MG tablet Take 10 mg by mouth daily as needed. For sinus drainage    Historical Provider, MD  metFORMIN (GLUCOPHAGE) 1000 MG tablet Take 1,000 mg by mouth 2 (two) times daily with a meal.      Historical Provider, MD  Naproxen Sodium (ALEVE) 220 MG CAPS Take 2 capsules by mouth  daily as needed (pain).    Historical Provider, MD  Omega-3 Fatty Acids (FISH OIL) 1000 MG CAPS Take 1 capsule by mouth 2 (two) times daily.     Historical Provider, MD  oxybutynin (DITROPAN-XL) 10 MG 24 hr tablet Take 10 mg by mouth daily.    Historical Provider, MD  Polyvinyl Alcohol-Povidone (REFRESH OP) Place 2 drops into both eyes as needed (for dry eyes).    Historical Provider, MD  traMADol (ULTRAM) 50 MG tablet Take 1 tablet (50 mg total) by mouth every 6 (six) hours as needed. 10/18/15 10/17/16  Charmayne Sheer Beers, PA-C  verapamil (CALAN-SR) 180 MG CR tablet Take 180 mg by mouth 2 (two) times daily.     Historical Provider, MD    Family History History reviewed. No  pertinent family history.  Social History Social History  Substance Use Topics  . Smoking status: Never Smoker  . Smokeless tobacco: Never Used  . Alcohol use No     Allergies   Hydroxyzine pamoate; Oxycodone-acetaminophen; Prochlorperazine edisylate; Chlorpromazine hcl; Codeine; and Propoxyphene n-acetaminophen   Review of Systems Review of Systems  Constitutional: Negative for chills and fever.  HENT: Negative for ear pain and sore throat.   Eyes: Negative for pain and visual disturbance.  Respiratory: Negative for cough and shortness of breath.   Cardiovascular: Negative for chest pain and palpitations.  Gastrointestinal: Negative for abdominal pain and vomiting.  Genitourinary: Negative for dysuria and hematuria.  Musculoskeletal: Positive for arthralgias. Negative for back pain.  Skin: Negative for color change and rash.  Neurological: Negative for seizures and syncope.  All other systems reviewed and are negative.    Physical Exam Updated Vital Signs BP 132/67 (BP Location: Left Arm)   Pulse 80   Temp 98 F (36.7 C) (Oral)   Resp 20   Ht 5\' 8"  (1.727 m)   Wt 102.1 kg   SpO2 97%   BMI 34.21 kg/m   Physical Exam  Constitutional: She appears well-developed and well-nourished. No distress.  HENT:  Head: Normocephalic and atraumatic.  Eyes: Conjunctivae are normal.  Neck: Neck supple.  Cardiovascular: Normal rate and regular rhythm.   No murmur heard. Pulmonary/Chest: Effort normal. No respiratory distress.  Musculoskeletal: She exhibits no edema.  Examination of the left lower extremity shows patient is able to straight leg raise at the knee. She has good internal and external rotation of left hip with no discomfort. She has 0-100 range of motion of the left knee. Left knee is swollen but there is no warmth erythema or effusion noted. She has a negative Homans sign with no signs of edema throughout the left lower extremity. Knee is stable to valgus and varus  stress testing. Sensation is intact distally.  Neurological: She is alert.  Skin: Skin is warm and dry.  Psychiatric: She has a normal mood and affect.  Nursing note and vitals reviewed.    ED Treatments / Results  Labs (all labs ordered are listed, but only abnormal results are displayed) Labs Reviewed - No data to display  EKG  EKG Interpretation None       Radiology No results found.  Procedures Procedures (including critical care time)  Medications Ordered in ED Medications - No data to display   Initial Impression / Assessment and Plan / ED Course  I have reviewed the triage vital signs and the nursing notes.  Pertinent labs & imaging results that were available during my care of the patient were reviewed by me and  considered in my medical decision making (see chart for details).   66 year old female with chronic knee pain secondary to osteoarthritis. Recommend she continue with her Norco for her chronic pain. I did prescribe topical Voltaren gel. She will relay with assistive device. She is educated on signs and symptoms return to clinic for.  Final Clinical Impressions(s) / ED Diagnoses   Final diagnoses:  Acute pain of left knee  Primary osteoarthritis of left knee    New Prescriptions New Prescriptions   DICLOFENAC SODIUM (VOLTAREN) 1 % GEL    Apply 2 g topically 4 (four) times daily.     Evon Slackhomas C Gaines, PA-C 03/19/16 1916    Minna AntisKevin Paduchowski, MD 03/19/16 2032

## 2016-03-19 NOTE — ED Triage Notes (Signed)
States she fell and is now having left knee pain, awake and alert, uses cane

## 2016-03-19 NOTE — Discharge Instructions (Signed)
Please rest ice and elevate the knee. Continue with Norco as needed for pain. Use topical Voltaren gel as needed for additional pain relief. Please follow-up with your orthopedic surgeon.

## 2016-09-06 ENCOUNTER — Encounter (HOSPITAL_COMMUNITY): Payer: Self-pay | Admitting: Emergency Medicine

## 2016-09-06 DIAGNOSIS — I10 Essential (primary) hypertension: Secondary | ICD-10-CM | POA: Insufficient documentation

## 2016-09-06 DIAGNOSIS — M79604 Pain in right leg: Secondary | ICD-10-CM | POA: Insufficient documentation

## 2016-09-06 DIAGNOSIS — E119 Type 2 diabetes mellitus without complications: Secondary | ICD-10-CM | POA: Insufficient documentation

## 2016-09-06 DIAGNOSIS — Z794 Long term (current) use of insulin: Secondary | ICD-10-CM | POA: Insufficient documentation

## 2016-09-06 DIAGNOSIS — M25551 Pain in right hip: Secondary | ICD-10-CM | POA: Insufficient documentation

## 2016-09-06 DIAGNOSIS — Z79899 Other long term (current) drug therapy: Secondary | ICD-10-CM | POA: Diagnosis not present

## 2016-09-06 DIAGNOSIS — E039 Hypothyroidism, unspecified: Secondary | ICD-10-CM | POA: Diagnosis not present

## 2016-09-06 NOTE — ED Triage Notes (Signed)
Pt c/o right groin pain that radiates to the right leg x 2 months. Pt [facility], told she has arthritis. Pt states pain became so severe that she couldn't stand it anymore.

## 2016-09-07 ENCOUNTER — Emergency Department (HOSPITAL_COMMUNITY)
Admission: EM | Admit: 2016-09-07 | Discharge: 2016-09-07 | Disposition: A | Discharge status: Home / Self Care | Payer: Medicare Other | Attending: Emergency Medicine | Admitting: Emergency Medicine

## 2016-09-07 ENCOUNTER — Emergency Department (HOSPITAL_COMMUNITY): Payer: Medicare Other

## 2016-09-07 DIAGNOSIS — M25551 Pain in right hip: Secondary | ICD-10-CM

## 2016-09-07 DIAGNOSIS — R52 Pain, unspecified: Secondary | ICD-10-CM

## 2016-09-07 LAB — D-DIMER, QUANTITATIVE: D-Dimer, Quant: 0.32 ug/mL-FEU (ref 0.00–0.50)

## 2016-09-07 NOTE — Discharge Instructions (Signed)
As discussed, it is unlikely that your symptoms are due to a blood clot and your blood test was negative today. Your xray shows arthritis of your hip joints and degenerative changes in your lower spine, no dislocation or fracture.   Follow up with your primary care provider if symptoms persist and follow through with recommended outpatient imaging if indicated. Continue with your home pain medications as needed. Apply heat to the area and follow the instructions for modified exercises provided.  Return if you experience loss of bowel or bladder function, sensation and your extremity or weakness, or other concerning symptoms in the meantime.

## 2016-09-07 NOTE — ED Provider Notes (Signed)
[facility]-EMER[facility]ENCY DEPT Provider Note   CSN: [id number] Arrival date & time: 09/06/16  1842     History   Chief Complaint Chief Complaint  Patient presents with  . Leg Pain  . Hip Pain    HPI Briana Scott is a 66 y.o. female presenting with 6 months or right hip pain. She was seen by her PCP and MRI imaging was performed revealing arthritis. She improved and 2.5 months ago started experiencing sharp shooting pains in her right groin radiating down her leg. She was evaluated again at the [facility]. She explains that she was seen this Monday and had an x-ray done. She was then referred for further imaging but did not make it there. She decided to come to the emergency room if she wants to know what is wrong and he didn't know that it is not a blood clot.she denies any new injury, fall, reports that the pain has been the same for the last 2-1/2 months. No new injury or trauma. Patient also reports that her right knee feels numb, No numbness elsewhere or weakness, no loss of bowel or bladder function, fever, chills, night sweats. No history of DVT/PE, prolonged immobilization, recent surgery, shortness of breath, chest pain, cough, estrogen use or malignancy.  HPI  Past Medical History:  Diagnosis Date  . Diabetes mellitus   . Hypertension   . Mitral valve prolapse     Patient Active Problem List   Diagnosis Date Noted  . DIZZINESS 08/16/2009  . CHEST PAIN 08/16/2009  . HYPOTHYROIDISM 08/12/2009  . DM 08/12/2009  . HYPERTENSION 08/12/2009  . MITRAL VALVE PROLAPSE 08/12/2009    Past Surgical History:  Procedure Laterality Date  . ABDOMINAL HYSTERECTOMY    . KNEE SURGERY    . TONSILLECTOMY      OB History    No data available       Home Medications    Prior to Admission medications   Medication Sig Start Date End Date Taking? Authorizing Provider  atorvastatin (LIPITOR) 40 MG tablet Take 20 mg by mouth at bedtime.   Yes [provider]  Cholecalciferol (VITAMIN D)  2000 units CAPS Take 1 capsule by mouth 2 (two) times daily.   Yes [provider]  furosemide (LASIX) 40 MG tablet Take 40 mg by mouth daily.   Yes [provider]  HYDROcodone-acetaminophen (NORCO/VICODIN) 5-325 MG per tablet Take 1-2 tablets by mouth every 6 (six) hours as needed for pain. Patient taking differently: Take 1-2 tablets by mouth 3 (three) times daily.  03/16/12  Yes Cathren LaineSteinl, Kevin, MD  insulin aspart protamine-insulin aspart (NOVOLOG 70/30) (70-30) 100 UNIT/ML injection Inject 30-60 Units into the skin See admin instructions. Takes 60 units in the am and 30 units in the pm   Yes [provider]  levothyroxine (SYNTHROID, LEVOTHROID) 75 MCG tablet Take 75 mcg by mouth daily.     Yes [provider]  lisinopril (PRINIVIL,ZESTRIL) 40 MG tablet Take 20 mg by mouth 2 (two) times daily.   Yes [provider]  magnesium oxide (MAG-OX) 400 MG tablet Take 400 mg by mouth daily.   Yes [provider]  Omega-3 Fatty Acids (FISH OIL) 1000 MG CAPS Take 2 capsules by mouth 2 (two) times daily.    Yes [provider]  Polyethyl Glycol-Propyl Glycol (SYSTANE) 0.4-0.3 % SOLN Place 1 drop into both eyes daily.   Yes [provider]  PRESCRIPTION MEDICATION Place 1 spray into both nostrils at bedtime. Nasal spray  Yes [provider]  Probiotic Product (PROBIOTIC PO) Take 2 capsules by mouth 2 (two) times daily.   Yes [provider]  verapamil (CALAN-SR) 180 MG CR tablet Take 180 mg by mouth 2 (two) times daily.    Yes [provider]  cephALEXin (KEFLEX) 500 MG capsule Take 1 capsule (500 mg total) by mouth 2 (two) times daily. Patient not taking: Reported on 09/07/2016 11/08/15   Sharman Cheek, MD  diclofenac sodium (VOLTAREN) 1 % GEL Apply 2 g topically 4 (four) times daily. Patient not taking: Reported on 09/07/2016 03/19/16   Evon Slack, PA-C  traMADol (ULTRAM) 50 MG tablet Take 1 tablet (50  mg total) by mouth every 6 (six) hours as needed. Patient not taking: Reported on 09/07/2016 10/18/15 10/17/16  Evangeline Dakin, PA-C    Family History No family history on file.  Social History Social History  Substance Use Topics  . Smoking status: Never Smoker  . Smokeless tobacco: Never Used  . Alcohol use No     Allergies   Hydroxyzine pamoate; Oxycodone-acetaminophen; Prochlorperazine edisylate; Chlorpromazine hcl; Codeine; and Propoxyphene n-acetaminophen   Review of Systems Review of Systems  Constitutional: Negative for chills, diaphoresis, fatigue, fever and unexpected weight change.  HENT: Negative for ear pain and sore throat.   Respiratory: Negative for cough, choking, chest tightness, shortness of breath, wheezing and stridor.   Cardiovascular: Negative for chest pain, palpitations and leg swelling.  Gastrointestinal: Negative for abdominal pain, nausea and vomiting.  Genitourinary: Negative for difficulty urinating, dysuria and hematuria.  Musculoskeletal: Positive for arthralgias and gait problem. Negative for back pain, joint swelling, neck pain and neck stiffness.  Skin: Negative for color change, pallor, rash and wound.  Neurological: Positive for numbness. Negative for seizures, syncope and weakness.       Numbness on the right knee     Physical Exam Updated Vital Signs BP 138/71 (BP Location: Left Arm)   Pulse 86   Temp 98.3 F (36.8 C) (Oral)   Resp 18   Ht 5\' 8"  (1.727 m)   Wt 97.1 kg (214 lb)   SpO2 96%   BMI 32.54 kg/m   Physical Exam  Constitutional: She appears well-developed and well-nourished. No distress.  Afebrile, nontoxic-appearing, sitting comfortably in bed in no acute distress.  HENT:  Head: Normocephalic and atraumatic.  Neck: Normal range of motion. Neck supple.  Cardiovascular: Normal rate, regular rhythm, normal heart sounds and intact distal pulses.   No murmur heard. Strong dorsalis pedis pulses  Pulmonary/Chest: Effort  normal. No respiratory distress. She has no wheezes. She has no rales.  Abdominal: She exhibits no distension.  Musculoskeletal: Normal range of motion. She exhibits tenderness. She exhibits no edema or deformity.  Neurological: She is alert. No sensory deficit.  She was observed ambulating using her cane and reports this is baseline for her. 5 out of 5 strength flexion at the hip bilaterally, flexion and extension at the knee, plantar flexion dorsiflexion. Neurovascularly intact distally.  Skin: Skin is warm and dry. No rash noted. She is not diaphoretic. No erythema. No pallor.  Psychiatric: She has a normal mood and affect.  Nursing note and vitals reviewed.    ED Treatments / Results  Labs (all labs ordered are listed, but only abnormal results are displayed) Labs Reviewed  D-DIMER, QUANTITATIVE (NOT AT [facility])    EKG  EKG Interpretation None       Radiology Dg Hip Unilat With Pelvis 2-3 Views Right  Result  Date: 09/07/2016 CLINICAL DATA:  RIGHT groin pain radiating to RIGHT leg for 2 months. History of arthritis. EXAM: DG HIP (WITH OR WITHOUT PELVIS) 2-3V RIGHT COMPARISON:  None. FINDINGS: Femoral heads are well formed and located, mild superolateral acetabular spurring bilaterally. Degenerative change of the included lumbar spine. Hip joint spaces are intact. Sacroiliac joints are symmetric. No destructive bony lesions. Included soft tissue planes are non-suspicious. Surgical clips and phleboliths project in the pelvis. IMPRESSION: No acute osseous process. Early bilateral hip osteoarthrosis. Electronically Signed   By: Awilda Metro M.D.   On: 09/07/2016 05:11    Procedures Procedures (including critical care time)  Medications Ordered in ED Medications - No data to display   Initial Impression / Assessment and Plan / ED Course  I have reviewed the triage vital signs and the nursing notes.  Pertinent labs & imaging results that were available during my care of the  patient were reviewed by me and considered in my medical decision making (see chart for details).    Patient presents with chronic right hip pain and has been evaluated multiple times. Unable to obtain records from prior workups.  Ordered plain films and dimer to rule out DVT  Exam is reassuring, patient is ambulatory and pain appears to be chronic in nature without new acute injury.  X-ray: showing arthritis bilaterally.  Encouraged patient to follow up with her primary care provider and obtain further imaging as requested.  I do not see a reason to order emergent MRI in the emergency department here as patient does not have any red flags or new symptoms. No loss of bowel or bladder function, no new or progressive focal neuro deficits.  Discharge patient home with close follow-up with PCP. Patient is particular about the medication she takes and states she cannot take Tylenol or muscle relaxant and she already has her pain medications at home.  Discussed strict return precautions and advised to return to the emergency department if experiencing any new or worsening symptoms. Instructions were understood and patient agreed with discharge plan.  Final Clinical Impressions(s) / ED Diagnoses   Final diagnoses:  Right hip pain    New Prescriptions Discharge Medication List as of 09/07/2016  6:10 AM       Georgiana Shore, PA-C 09/07/16 0809    Dione Booze, MD 09/07/16 [phone]

## 2016-09-07 NOTE — ED Notes (Signed)
Pt taken to xray 

## 2016-09-07 NOTE — ED Notes (Signed)
Jessica (PA) at bedside.

## 2016-09-07 NOTE — ED Notes (Signed)
Pt requesting update; spoke with provider who will speak with the patient again

## 2016-09-07 NOTE — ED Notes (Signed)
Patient called out requesting help with using the restroom.  Patient helped to the bedside commode by EMT.

## 2017-03-18 IMAGING — CR DG CHEST 2V
2 series · 2 of 2 positions shown · non-contrast
Comparison: Prior radiograph from 01/12/2015.

CLINICAL DATA: Initial evaluation for

EXAM:
CHEST  2 VIEW

[chest pa]
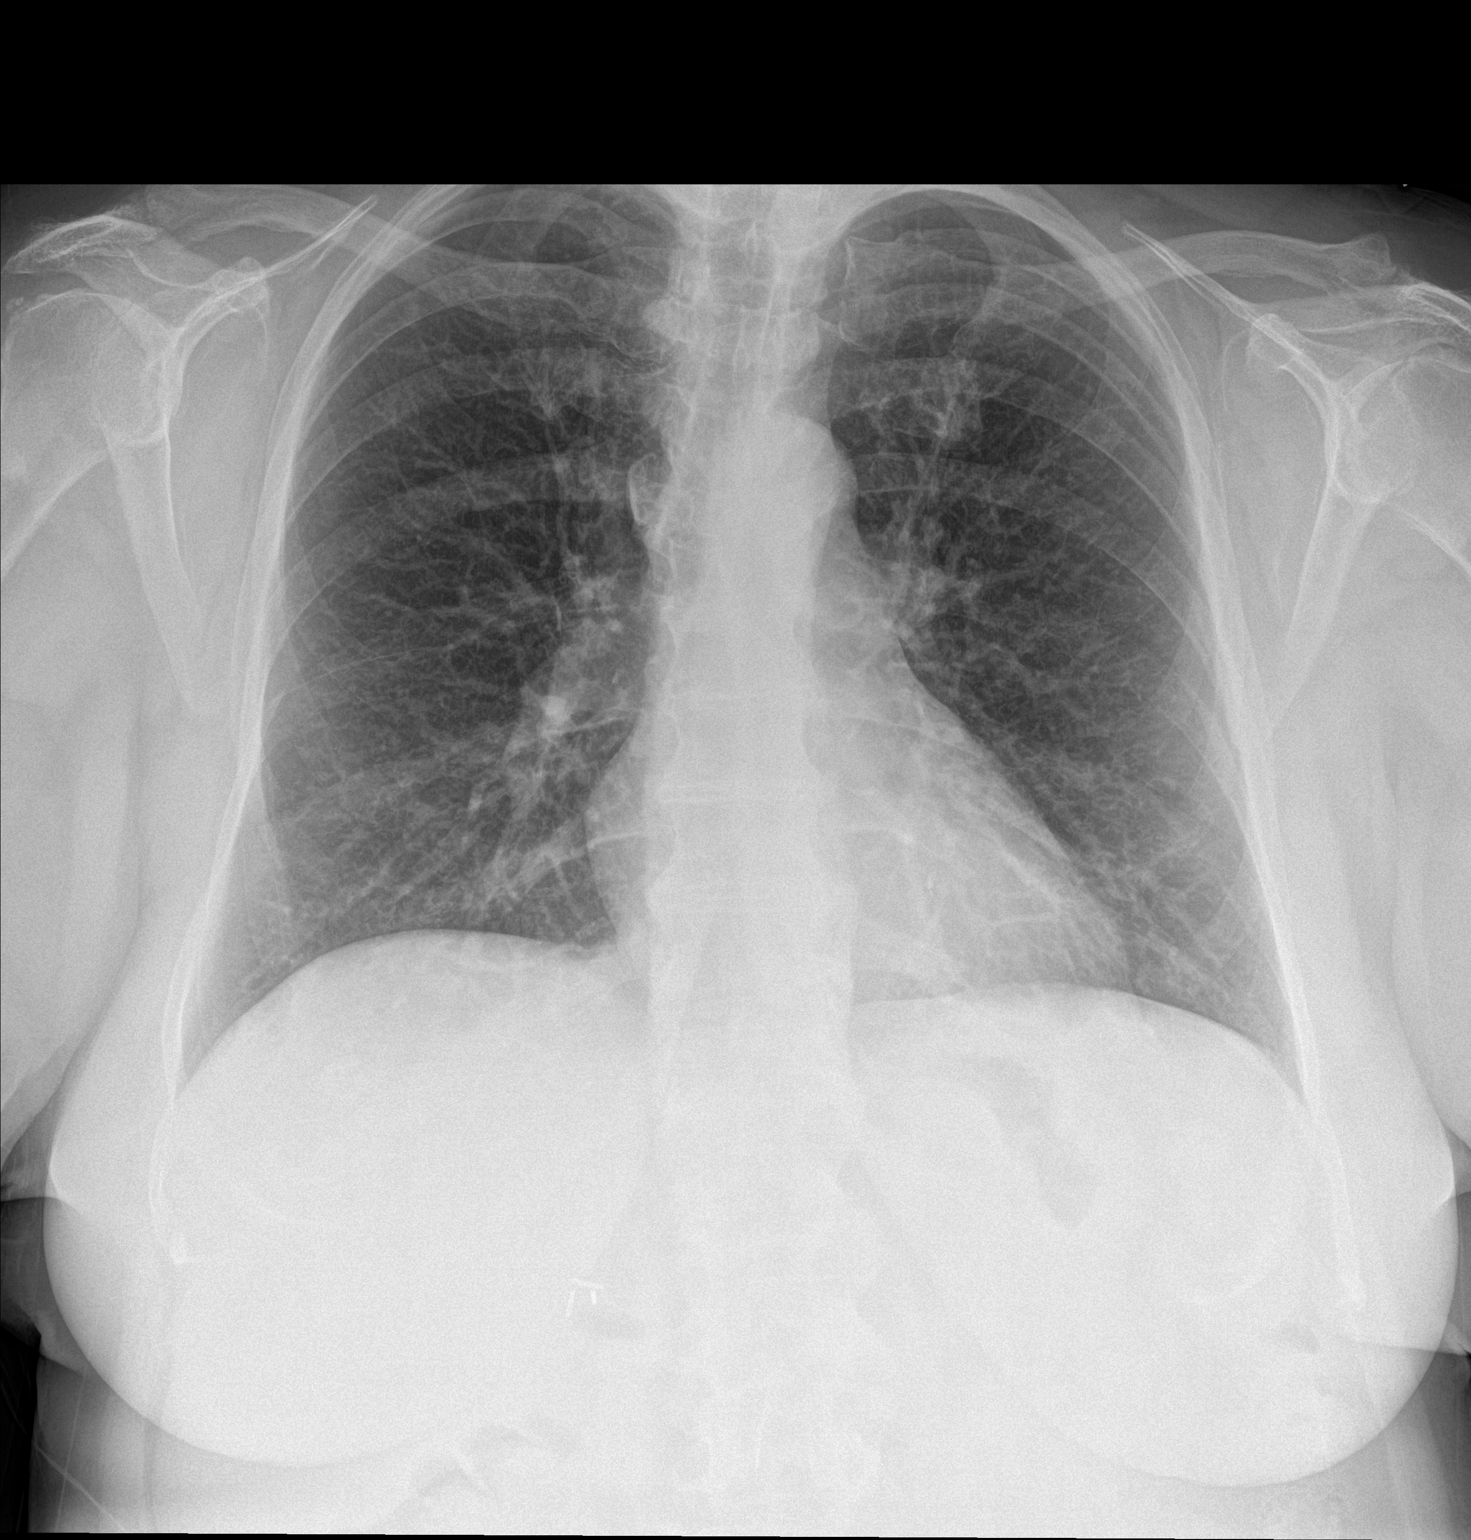

[chest lat]
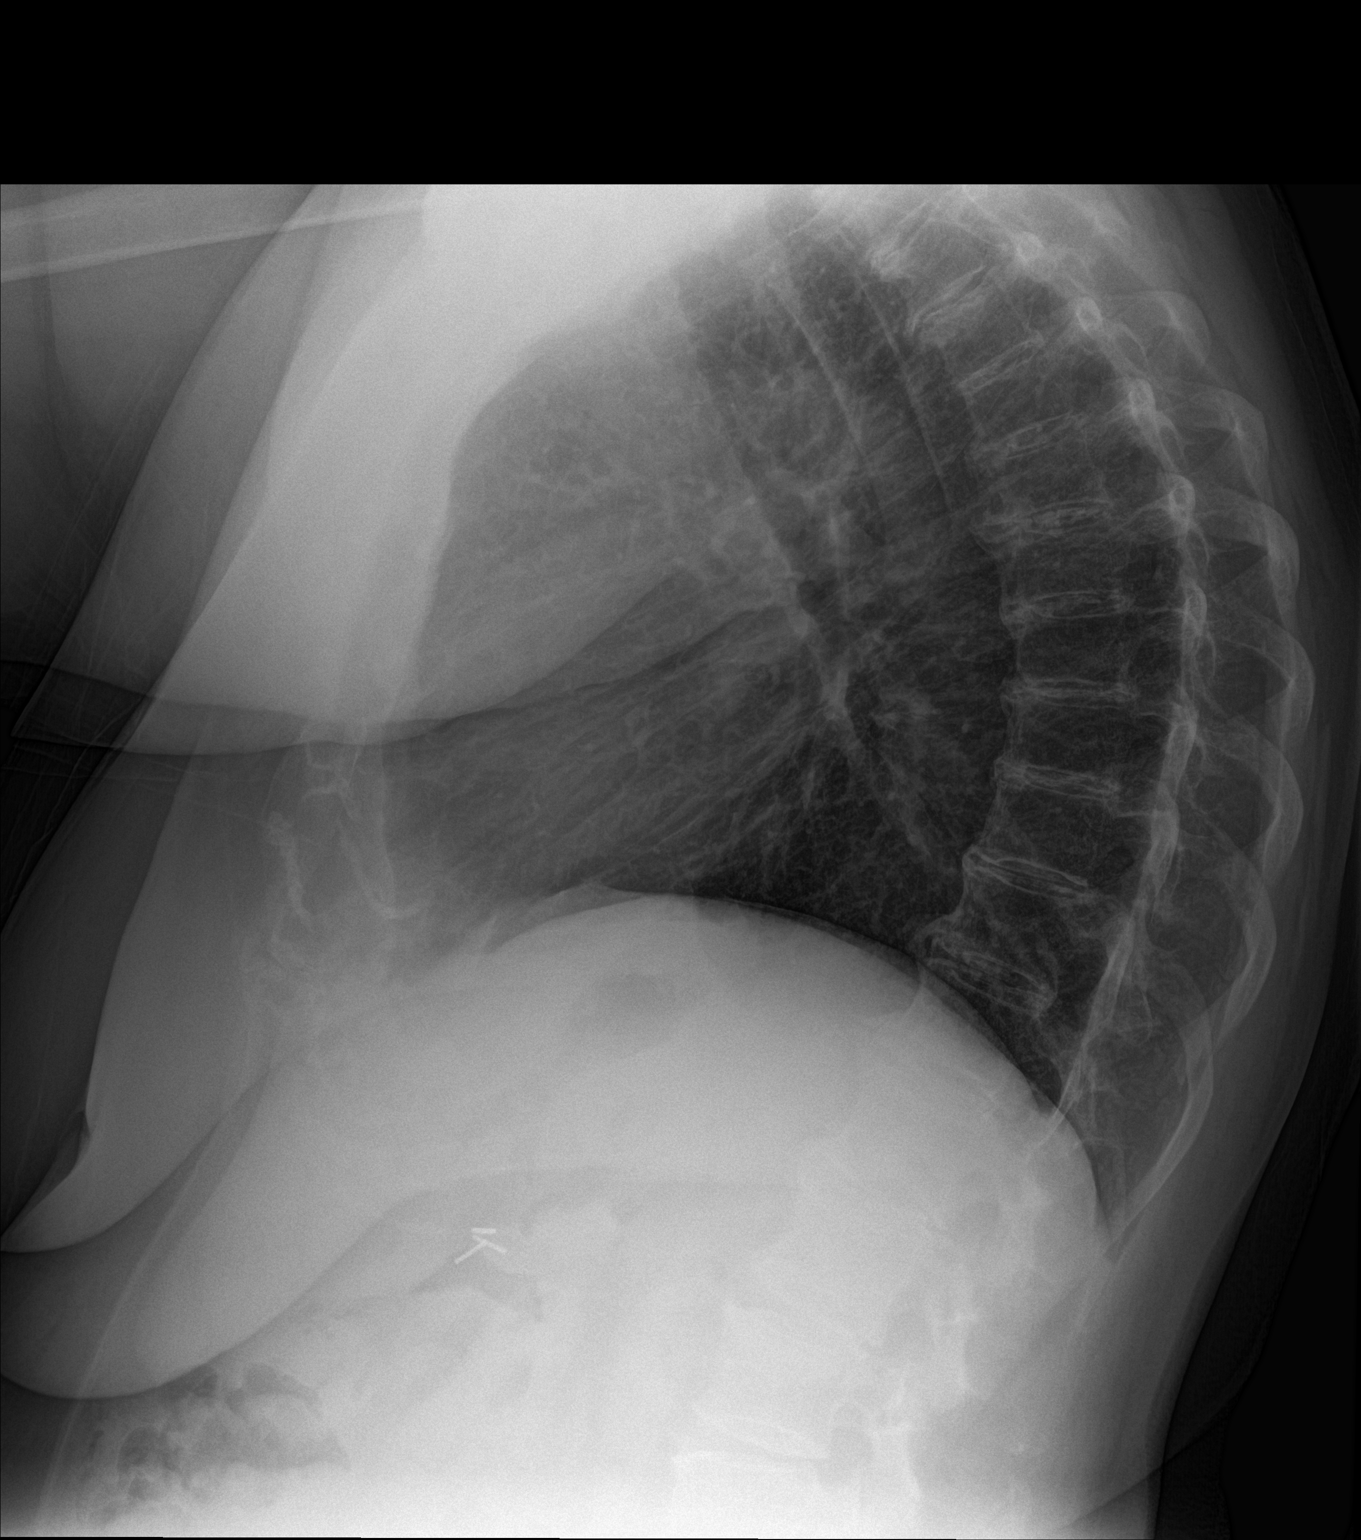

[2 of 2 positions shown; findings below may reference images not displayed]

FINDINGS: Cardiac and mediastinal silhouettes are within normal limits.

Lungs are normally inflated. Linear scarring at the left upper lobe
noted, stable. No focal infiltrates. No pulmonary edema or pleural
effusion. No pneumothorax.

No acute osseous abnormality.
IMPRESSION: No active cardiopulmonary disease.

## 2017-05-09 ENCOUNTER — Other Ambulatory Visit: Payer: Self-pay

## 2017-05-09 ENCOUNTER — Encounter: Payer: Self-pay | Admitting: Emergency Medicine

## 2017-05-09 ENCOUNTER — Emergency Department
Admission: EM | Admit: 2017-05-09 | Discharge: 2017-05-09 | Disposition: A | Discharge status: Home / Self Care | Payer: Medicare Other | Attending: Emergency Medicine | Admitting: Emergency Medicine

## 2017-05-09 DIAGNOSIS — E039 Hypothyroidism, unspecified: Secondary | ICD-10-CM | POA: Diagnosis not present

## 2017-05-09 DIAGNOSIS — M7662 Achilles tendinitis, left leg: Secondary | ICD-10-CM | POA: Insufficient documentation

## 2017-05-09 DIAGNOSIS — Z79899 Other long term (current) drug therapy: Secondary | ICD-10-CM | POA: Insufficient documentation

## 2017-05-09 DIAGNOSIS — E119 Type 2 diabetes mellitus without complications: Secondary | ICD-10-CM | POA: Diagnosis not present

## 2017-05-09 DIAGNOSIS — Z794 Long term (current) use of insulin: Secondary | ICD-10-CM | POA: Diagnosis not present

## 2017-05-09 DIAGNOSIS — M79672 Pain in left foot: Secondary | ICD-10-CM | POA: Diagnosis present

## 2017-05-09 DIAGNOSIS — I10 Essential (primary) hypertension: Secondary | ICD-10-CM | POA: Diagnosis not present

## 2017-05-09 NOTE — ED Notes (Signed)
Pt states [facility] and told that she has a bone spur under her L achilles tendon with some possible tearing. Pt states was told if pain became unbearable she could come to ER for further evaluation and possible MRI. Pt c/o swelling to posterior R ankle, +swelling noted by this RN, and pain with ambulation.

## 2017-05-09 NOTE — ED Provider Notes (Signed)
[facility] Emergency Department Provider Note  ____________________________________________  Time seen: Approximately 11:55 PM  I have reviewed the triage vital signs and the nursing notes.   HISTORY  Chief Complaint Foot Pain    HPI Briana Scott is a 66 y.o. female presents to the emergency department with left Achilles tendon pain.  Patient reports that Achilles tendon pain is chronic in nature due to a heel spur.  Patient is in the process of making an appointment through the [place] to have heel spur removed.  Patient reports that she cannot tolerate anti-inflammatories or prednisone.  Patient denies recent traumas or falls.  She currently rates her pain at 10 out of 10 in intensity.  Past Medical History:  Diagnosis Date  . Diabetes mellitus   . Hypertension   . Mitral valve prolapse     Patient Active Problem List   Diagnosis Date Noted  . DIZZINESS 08/16/2009  . CHEST PAIN 08/16/2009  . HYPOTHYROIDISM 08/12/2009  . DM 08/12/2009  . HYPERTENSION 08/12/2009  . MITRAL VALVE PROLAPSE 08/12/2009    Past Surgical History:  Procedure Laterality Date  . ABDOMINAL HYSTERECTOMY    . KNEE SURGERY    . TONSILLECTOMY      Prior to Admission medications   Medication Sig Start Date End Date Taking? Authorizing Provider  atorvastatin (LIPITOR) 40 MG tablet Take 20 mg by mouth at bedtime.    [provider]  cephALEXin (KEFLEX) 500 MG capsule Take 1 capsule (500 mg total) by mouth 2 (two) times daily. Patient not taking: Reported on 09/07/2016 11/08/15   Sharman Cheek, MD  Cholecalciferol (VITAMIN D) 2000 units CAPS Take 1 capsule by mouth 2 (two) times daily.    [provider]  diclofenac sodium (VOLTAREN) 1 % GEL Apply 2 g topically 4 (four) times daily. Patient not taking: Reported on 09/07/2016 03/19/16   Evon Slack, PA-C  furosemide (LASIX) 40 MG tablet Take 40 mg by mouth daily.    [provider]   HYDROcodone-acetaminophen (NORCO/VICODIN) 5-325 MG per tablet Take 1-2 tablets by mouth every 6 (six) hours as needed for pain. Patient taking differently: Take 1-2 tablets by mouth 3 (three) times daily.  03/16/12   Cathren Laine, MD  insulin aspart protamine-insulin aspart (NOVOLOG 70/30) (70-30) 100 UNIT/ML injection Inject 30-60 Units into the skin See admin instructions. Takes 60 units in the am and 30 units in the pm    [provider]  levothyroxine (SYNTHROID, LEVOTHROID) 75 MCG tablet Take 75 mcg by mouth daily.      [provider]  lisinopril (PRINIVIL,ZESTRIL) 40 MG tablet Take 20 mg by mouth 2 (two) times daily.    [provider]  magnesium oxide (MAG-OX) 400 MG tablet Take 400 mg by mouth daily.    [provider]  Omega-3 Fatty Acids (FISH OIL) 1000 MG CAPS Take 2 capsules by mouth 2 (two) times daily.     [provider]  Polyethyl Glycol-Propyl Glycol (SYSTANE) 0.4-0.3 % SOLN Place 1 drop into both eyes daily.    [provider]  PRESCRIPTION MEDICATION Place 1 spray into both nostrils at bedtime. Nasal spray    [provider]  Probiotic Product (PROBIOTIC PO) Take 2 capsules by mouth 2 (two) times daily.    [provider]  verapamil (CALAN-SR) 180 MG CR tablet Take 180 mg by mouth 2 (two) times daily.     [provider]    Allergies Hydroxyzine pamoate; Oxycodone-acetaminophen; Prochlorperazine  edisylate; Chlorpromazine hcl; Codeine; and Propoxyphene n-acetaminophen  No family history on file.  Social History Social History   Tobacco Use  . Smoking status: Never Smoker  . Smokeless tobacco: Never Used  Substance Use Topics  . Alcohol use: No  . Drug use: No     Review of Systems  Constitutional: No fever/chills Eyes: No visual changes. No discharge ENT: No upper respiratory complaints. Cardiovascular: no chest pain. Respiratory: no cough. No SOB. Gastrointestinal: No abdominal  pain.  No nausea, no vomiting.  No diarrhea.  No constipation. Musculoskeletal: Patient has left heel pain. Skin: Negative for rash, abrasions, lacerations, ecchymosis. Neurological: Negative for headaches, focal weakness or numbness.   ____________________________________________   PHYSICAL EXAM:  VITAL SIGNS: ED Triage Vitals  Enc Vitals Group     BP 05/09/17 2100 133/66     Pulse Rate 05/09/17 2100 80     Resp 05/09/17 2100 18     Temp 05/09/17 2100 98.1 F (36.7 C)     Temp Source 05/09/17 2100 Oral     SpO2 05/09/17 2100 98 %     Weight 05/09/17 2059 217 lb (98.4 kg)     Height 05/09/17 2059 5\' 8"  (1.727 m)     Head Circumference --      Peak Flow --      Pain Score 05/09/17 2059 8     Pain Loc --      Pain Edu? --      Excl. in GC? --      Constitutional: Alert and oriented. Well appearing and in no acute distress. Eyes: Conjunctivae are normal. PERRL. EOMI. Head: Atraumatic. Cardiovascular: Normal rate, regular rhythm. Normal S1 and S2.  Good peripheral circulation. Respiratory: Normal respiratory effort without tachypnea or retractions. Lungs CTAB. Good air entry to the bases with no decreased or absent breath sounds. Musculoskeletal: Full range of motion to all extremities. No gross deformities appreciated.  Patient has tenderness elicited with palpation over the insertion for the Achilles tendon, left.  Achilles tendon is intact.  Palpable dorsalis pedis pulse, left. Neurologic:  Normal speech and language. No gross focal neurologic deficits are appreciated.  Skin:  Skin is warm, dry and intact. No rash noted.   ____________________________________________   LABS (all labs ordered are listed, but only abnormal results are displayed)  Labs Reviewed - No data to display ____________________________________________  EKG   ____________________________________________  RADIOLOGY   No results  found.  ____________________________________________    PROCEDURES  Procedure(s) performed:    Procedures    Medications - No data to display   ____________________________________________   INITIAL IMPRESSION / ASSESSMENT AND PLAN / ED COURSE  Pertinent labs & imaging results that were available during my care of the patient were reviewed by me and considered in my medical decision making (see chart for details).  Review of the [place] CSRS was performed in accordance of the [facility] prior to dispensing any controlled drugs.     Assessment and plan Achilles tendinitis Patient presents to the emergency department with chronic Achilles tendinitis.  I explained to patient that given her current pain management regimen, additional narcotic pain medications cannot be prescribed.  Patient opted to have left lower leg splint in the emergency department in order to optimize pain management.  Patient was advised to follow-up with orthopedics as soon as possible.  Vital signs are reassuring prior to discharge.  All patient questions were answered.    ____________________________________________  FINAL CLINICAL IMPRESSION(S) / ED DIAGNOSES  Final diagnoses:  Achilles tendinitis of left lower extremity      NEW MEDICATIONS STARTED DURING THIS VISIT:  ED Discharge Orders    None          This chart was dictated using voice recognition software/Dragon. Despite best efforts to proofread, errors can occur which can change the meaning. Any change was purely unintentional.    Orvil FeilWoods, Amara Manalang M, PA-C 05/10/17 0000    Jeanmarie PlantMcShane, James A, MD 05/10/17 1505

## 2017-05-09 NOTE — ED Triage Notes (Addendum)
Patient ambulatory to triage with steady gait, without difficulty or distress noted; pt reports ? Left achilles injury; c/o pain x 4 wks [facility]

## 2017-05-12 ENCOUNTER — Emergency Department: Payer: Medicare Other

## 2017-05-12 ENCOUNTER — Encounter: Payer: Self-pay | Admitting: Emergency Medicine

## 2017-05-12 ENCOUNTER — Emergency Department
Admission: EM | Admit: 2017-05-12 | Discharge: 2017-05-12 | Disposition: A | Discharge status: Home / Self Care | Payer: Medicare Other | Attending: Emergency Medicine | Admitting: Emergency Medicine

## 2017-05-12 DIAGNOSIS — Y92513 Shop (commercial) as the place of occurrence of the external cause: Secondary | ICD-10-CM | POA: Diagnosis not present

## 2017-05-12 DIAGNOSIS — M1712 Unilateral primary osteoarthritis, left knee: Secondary | ICD-10-CM | POA: Diagnosis not present

## 2017-05-12 DIAGNOSIS — Z79899 Other long term (current) drug therapy: Secondary | ICD-10-CM | POA: Insufficient documentation

## 2017-05-12 DIAGNOSIS — Y9389 Activity, other specified: Secondary | ICD-10-CM | POA: Insufficient documentation

## 2017-05-12 DIAGNOSIS — S8002XA Contusion of left knee, initial encounter: Secondary | ICD-10-CM | POA: Diagnosis not present

## 2017-05-12 DIAGNOSIS — S8992XA Unspecified injury of left lower leg, initial encounter: Secondary | ICD-10-CM | POA: Diagnosis present

## 2017-05-12 DIAGNOSIS — W010XXA Fall on same level from slipping, tripping and stumbling without subsequent striking against object, initial encounter: Secondary | ICD-10-CM | POA: Insufficient documentation

## 2017-05-12 DIAGNOSIS — Z794 Long term (current) use of insulin: Secondary | ICD-10-CM | POA: Insufficient documentation

## 2017-05-12 DIAGNOSIS — E119 Type 2 diabetes mellitus without complications: Secondary | ICD-10-CM | POA: Diagnosis not present

## 2017-05-12 DIAGNOSIS — Y999 Unspecified external cause status: Secondary | ICD-10-CM | POA: Diagnosis not present

## 2017-05-12 DIAGNOSIS — E039 Hypothyroidism, unspecified: Secondary | ICD-10-CM | POA: Insufficient documentation

## 2017-05-12 DIAGNOSIS — I10 Essential (primary) hypertension: Secondary | ICD-10-CM | POA: Insufficient documentation

## 2017-05-12 DIAGNOSIS — W19XXXA Unspecified fall, initial encounter: Secondary | ICD-10-CM

## 2017-05-12 MED ORDER — HYDROCODONE-ACETAMINOPHEN 5-325 MG PO TABS
1.0000 | ORAL_TABLET | Freq: Once | ORAL | Status: DC
  Filled 2017-05-12: qty 1

## 2017-05-12 MED ORDER — KETOROLAC TROMETHAMINE 30 MG/ML IJ SOLN
15.0000 mg | Freq: Once | INTRAMUSCULAR | Status: DC
Start: 2017-05-12 — End: 2017-05-13
  Filled 2017-05-12: qty 1

## 2017-05-12 MED ORDER — HYDROCODONE-ACETAMINOPHEN 5-325 MG PO TABS
1.0000 | ORAL_TABLET | Freq: Once | ORAL | Status: AC
  Administered 2017-05-12: 1 via ORAL
  Filled 2017-05-12: qty 1

## 2017-05-12 NOTE — ED Notes (Signed)
Pt reached son coming to pick up

## 2017-05-12 NOTE — ED Provider Notes (Signed)
[facility] Emergency Department Provider Note  ____________________________________________  Time seen: Approximately 8:31 PM  I have reviewed the triage vital signs and the nursing notes.   HISTORY  Chief Complaint Knee Pain    HPI Briana Scott is a 67 y.o. female presents the emergency department complaining of left knee pain.  Patient [place] the [employer] when she slipped on some water on the flooring, falling and landing on her left knee.  Patient states that she has a significant history of osteoarthritis.  Patient is waiting for a knee replacement for both knees.  Patient was concerned that she may have fractured her knee in the process of falling.  Patient did not hit her head or lose consciousness.  Patient has other "bumps and bruises" but denies any significant complaints other than her knee.  Patient has not take any medication for this complaint prior to arrival.  Patient is on chronic pain management, has a pain management contract.  Patient states that she is "not here for any additional pain medicine, I just want to make sure I did not break my knee."  Patient denies any headache, visual changes, neck pain, back pain, abdominal pain, numbness and tingling.  Past Medical History:  Diagnosis Date  . Diabetes mellitus   . Hypertension   . Mitral valve prolapse     Patient Active Problem List   Diagnosis Date Noted  . DIZZINESS 08/16/2009  . CHEST PAIN 08/16/2009  . HYPOTHYROIDISM 08/12/2009  . DM 08/12/2009  . HYPERTENSION 08/12/2009  . MITRAL VALVE PROLAPSE 08/12/2009    Past Surgical History:  Procedure Laterality Date  . ABDOMINAL HYSTERECTOMY    . KNEE SURGERY    . TONSILLECTOMY      Prior to Admission medications   Medication Sig Start Date End Date Taking? Authorizing Provider  atorvastatin (LIPITOR) 40 MG tablet Take 20 mg by mouth at bedtime.    [provider]  cephALEXin (KEFLEX) 500 MG capsule Take 1 capsule  (500 mg total) by mouth 2 (two) times daily. Patient not taking: Reported on 09/07/2016 11/08/15   Sharman Cheek, MD  Cholecalciferol (VITAMIN D) 2000 units CAPS Take 1 capsule by mouth 2 (two) times daily.    [provider]  diclofenac sodium (VOLTAREN) 1 % GEL Apply 2 g topically 4 (four) times daily. Patient not taking: Reported on 09/07/2016 03/19/16   Evon Slack, PA-C  furosemide (LASIX) 40 MG tablet Take 40 mg by mouth daily.    [provider]  HYDROcodone-acetaminophen (NORCO/VICODIN) 5-325 MG per tablet Take 1-2 tablets by mouth every 6 (six) hours as needed for pain. Patient taking differently: Take 1-2 tablets by mouth 3 (three) times daily.  03/16/12   Cathren Laine, MD  insulin aspart protamine-insulin aspart (NOVOLOG 70/30) (70-30) 100 UNIT/ML injection Inject 30-60 Units into the skin See admin instructions. Takes 60 units in the am and 30 units in the pm    [provider]  levothyroxine (SYNTHROID, LEVOTHROID) 75 MCG tablet Take 75 mcg by mouth daily.      [provider]  lisinopril (PRINIVIL,ZESTRIL) 40 MG tablet Take 20 mg by mouth 2 (two) times daily.    [provider]  magnesium oxide (MAG-OX) 400 MG tablet Take 400 mg by mouth daily.    [provider]  Omega-3 Fatty Acids (FISH OIL) 1000 MG CAPS Take 2 capsules by mouth 2 (two) times daily.     [provider]  Polyethyl Glycol-Propyl  Glycol (SYSTANE) 0.4-0.3 % SOLN Place 1 drop into both eyes daily.    [provider]  PRESCRIPTION MEDICATION Place 1 spray into both nostrils at bedtime. Nasal spray    [provider]  Probiotic Product (PROBIOTIC PO) Take 2 capsules by mouth 2 (two) times daily.    [provider]  verapamil (CALAN-SR) 180 MG CR tablet Take 180 mg by mouth 2 (two) times daily.     [provider]    Allergies Hydroxyzine pamoate; Oxycodone-acetaminophen; Prochlorperazine edisylate; Chlorpromazine  hcl; Codeine; and Propoxyphene n-acetaminophen  No family history on file.  Social History Social History   Tobacco Use  . Smoking status: Never Smoker  . Smokeless tobacco: Never Used  Substance Use Topics  . Alcohol use: No  . Drug use: No     Review of Systems  Constitutional: No fever/chills Eyes: No visual changes. Cardiovascular: no chest pain. Respiratory: no cough. No SOB. Gastrointestinal: No abdominal pain.  No nausea, no vomiting.  Musculoskeletal: Positive for left knee pain. Skin: Negative for rash, abrasions, lacerations, ecchymosis. Neurological: Negative for headaches, focal weakness or numbness. 10-point ROS otherwise negative.  ____________________________________________   PHYSICAL EXAM:  VITAL SIGNS: ED Triage Vitals  Enc Vitals Group     BP 05/12/17 1841 (!) 118/57     Pulse Rate 05/12/17 1841 77     Resp 05/12/17 1841 20     Temp 05/12/17 1841 98 F (36.7 C)     Temp Source 05/12/17 1841 Oral     SpO2 05/12/17 1841 97 %     Weight 05/12/17 1840 217 lb (98.4 kg)     Height 05/12/17 1840 5\' 8"  (1.727 m)     Head Circumference --      Peak Flow --      Pain Score 05/12/17 1840 8     Pain Loc --      Pain Edu? --      Excl. in GC? --      Constitutional: Alert and oriented. Well appearing and in no acute distress. Eyes: Conjunctivae are normal. PERRL. EOMI. Head: Atraumatic. Neck: No stridor.    Cardiovascular: Normal rate, regular rhythm. Normal S1 and S2.  Good peripheral circulation. Respiratory: Normal respiratory effort without tachypnea or retractions. Lungs CTAB. Good air entry to the bases with no decreased or absent breath sounds. Musculoskeletal: Full range of motion to all extremities. No gross deformities appreciated.  Gross deformity, edema, abrasions or lacerations noted to left knee.  Patient is very tender to palpation over the anterior aspect, specifically the patella.  No palpable abnormality.  Varus, valgus, Lachman's,  McMurray's is negative.  Dorsalis pedis pulse intact distally.  Examination of the left hip and ankle is unremarkable.  Patient does have chronic Achilles tendinitis, and there is some tenderness to palpation in this distribution.  Dorsalis pedis pulse intact.  Sensation intact all digits. Neurologic:  Normal speech and language. No gross focal neurologic deficits are appreciated.  Skin:  Skin is warm, dry and intact. No rash noted. Psychiatric: Mood and affect are normal. Speech and behavior are normal. Patient exhibits appropriate insight and judgement.   ____________________________________________   LABS (all labs ordered are listed, but only abnormal results are displayed)  Labs Reviewed - No data to display ____________________________________________  EKG   ____________________________________________  RADIOLOGY Festus Barren Keyunna Coco, personally viewed and evaluated these images (plain radiographs) as part of my medical decision making, as well as reviewing the written report by the radiologist.  Dg Knee Complete 4 Views Left  Result Date: 05/12/2017 CLINICAL DATA:  Fall, left knee pain EXAM: LEFT KNEE - COMPLETE 4+ VIEW COMPARISON:  None. FINDINGS: No fracture or dislocation is seen. Moderate tricompartmental degenerative changes, most prominent in the patellofemoral compartment. Visualized soft tissues are within normal limits. No suprapatellar knee joint effusion. IMPRESSION: No fracture or dislocation is seen. Moderate degenerative changes. Electronically Signed   By: Charline BillsSriyesh  Krishnan M.D.   On: 05/12/2017 19:44    ____________________________________________    PROCEDURES  Procedure(s) performed:    Procedures    Medications  HYDROcodone-acetaminophen (NORCO/VICODIN) 5-325 MG per tablet 1 tablet (has no administration in time range)     ____________________________________________   INITIAL IMPRESSION / ASSESSMENT AND PLAN / ED COURSE  Pertinent labs &  imaging results that were available during my care of the patient were reviewed by me and considered in my medical decision making (see chart for details).  Review of the [place] CSRS was performed in accordance of the [facility] prior to dispensing any controlled drugs.     Patient's diagnosis is consistent with fall resulting in contusion of the left knee.  Patient also has a significant history of osteoarthritis.  Differential included contusion, ligament injury, fracture.  X-ray reveals significant osteoarthritis but no acute osseous abnormality.  Exam is reassuring.  Patient is to follow-up with orthopedics for further management of ongoing chronic knee pain.  No new prescriptions at this time.  Patient may take her scheduled pain medication at home.. Patient is given ED precautions to return to the ED for any worsening or new symptoms.     ____________________________________________  FINAL CLINICAL IMPRESSION(S) / ED DIAGNOSES  Final diagnoses:  Fall, initial encounter  Contusion of left knee, initial encounter  Primary osteoarthritis of left knee      NEW MEDICATIONS STARTED DURING THIS VISIT:  ED Discharge Orders    None          This chart was dictated using voice recognition software/Dragon. Despite best efforts to proofread, errors can occur which can change the meaning. Any change was purely unintentional.    Racheal PatchesCuthriell, Halah Whiteside D, PA-C 05/12/17 2055    Jene EveryKinner, Robert, MD 05/12/17 2252

## 2017-05-12 NOTE — ED Notes (Signed)
Son called in waiting room; pt wheeled to front door

## 2017-05-12 NOTE — ED Triage Notes (Signed)
Pt comes into the ED via POV c/o left knee pain after a mechanical fall in [place] [employer] [place] today.  Patient denies hitting her head.  Patient in NAD at this time.

## 2017-05-12 NOTE — ED Notes (Addendum)
Pt reports falling at [facility] after slipping in water, pt reports pain in lower back, left knee, and right hip, pt uses cane but denies hx of recent falls, pt is by self at this time  Pt appears without deformity and able to ambulate, pt drove self to hospital  Pt narcotic delayed to reach family member  Pt assisted to toilet

## 2017-05-12 NOTE — ED Notes (Signed)
Pt using phone to call ride.  

## 2017-05-12 NOTE — ED Notes (Signed)
Pt refused Toradol on the grounds that reaction before that was previously undocumented

## 2017-12-19 ENCOUNTER — Telehealth: Payer: Self-pay | Admitting: Gastroenterology

## 2017-12-19 NOTE — Telephone Encounter (Signed)
DOD Dr. Chales AbrahamsGupta will place records on Dr Urban GibsonGupta's desk for review  Last colon in 2014

## 2017-12-23 ENCOUNTER — Encounter: Payer: Self-pay | Admitting: Gastroenterology

## 2017-12-23 NOTE — Telephone Encounter (Signed)
Dr.Gupta reviewed records and accepted to see patient for a direct colonoscopy in the [facility]. Patient has been scheduled.

## 2018-02-04 ENCOUNTER — Encounter: Payer: Medicare Other | Admitting: Gastroenterology

## 2018-02-12 ENCOUNTER — Encounter

## 2018-02-20 ENCOUNTER — Encounter: Payer: Medicare Other | Admitting: Gastroenterology

## 2018-07-25 ENCOUNTER — Telehealth: Payer: Self-pay | Admitting: *Deleted

## 2018-07-25 NOTE — Telephone Encounter (Signed)
Spoke with patient. She states she had echo test done at [facility] clinic. Patient states for me to call them to get the results. Leveda Anna, RN community care nurse was called, left message for her to fax the echo results to me before pt's colonoscopy. Will wait on results.

## 2018-07-28 NOTE — Telephone Encounter (Signed)
John, please review. Is patient ok for [facility] or do we need cardiac clearance? Patient was scheduled for a stress test 10/2017 but did not complete this due to unable to get IV per [facility] clinic. Per records pt has hx MVP. Per pt's records last echo 2017, no results attached.  Please advise. Thank you, Montie Swiderski pv

## 2018-07-28 NOTE — Telephone Encounter (Signed)
Robbin,  We will need to see the most recent Cardiac Echo; please let me know when it is received from the [place].  Thanks,  Osvaldo Angst

## 2018-07-28 NOTE — Telephone Encounter (Signed)
Message left for Jodi,RN. At [facility] clinic to fax me ANY echo result that they have.

## 2018-07-30 NOTE — Telephone Encounter (Signed)
John,  Please review the last echo from the [place] under the Media tab- It was scanned in the chart yesterdays  date 6-30 under media  Then please advise, Thanks Lelan Pons

## 2018-07-31 NOTE — Telephone Encounter (Signed)
Thanks- Will proceed as scheduled  

## 2018-07-31 NOTE — Telephone Encounter (Signed)
Marie,  I have reviewed this pt's echo.  This pt is cleared for anesthetic care at [facility].  Thanks much,  Osvaldo Angst

## 2018-08-07 ENCOUNTER — Telehealth: Payer: Self-pay | Admitting: *Deleted

## 2018-08-07 ENCOUNTER — Other Ambulatory Visit: Payer: Self-pay

## 2018-08-07 NOTE — Telephone Encounter (Signed)
Called patient again, no answer. Left message for her to call us back tomorrow to get the PV rescheduled.

## 2018-08-07 NOTE — Telephone Encounter (Signed)
Patient has been called 3 times, twice at her appointment time and once at this time, no answer, left message for her call me back before 5 pm, if not the colonoscopy with be cancelled.

## 2018-08-08 ENCOUNTER — Encounter: Payer: Self-pay | Admitting: *Deleted

## 2018-08-08 NOTE — Telephone Encounter (Signed)
Called patient, no answer, left a message for her to call us back to reschedule Colon and PV. Colonoscopy cancelled and no show letter mailed to patient.

## 2018-08-08 NOTE — Telephone Encounter (Signed)
Patient was called again today by Trenton Gammon regarding the need to have a pre-visit, message was left for her to call us back to reschedule or the colonoscopy will be cancelled.

## 2018-08-19 ENCOUNTER — Other Ambulatory Visit: Payer: Self-pay

## 2018-08-19 ENCOUNTER — Ambulatory Visit (AMBULATORY_SURGERY_CENTER): Payer: Self-pay

## 2018-08-19 VITALS — Ht 68.0 in | Wt 214.0 lb

## 2018-08-19 DIAGNOSIS — Z1211 Encounter for screening for malignant neoplasm of colon: Secondary | ICD-10-CM

## 2018-08-19 MED ORDER — PEG 3350-KCL-NA BICARB-NACL 420 G PO SOLR: 4000.0000 mL | Freq: Once | ORAL | 0 refills | Status: AC

## 2018-08-19 NOTE — Progress Notes (Signed)
.  Denies allergies to eggs or soy products. Denies complication of anesthesia or sedation. Denies use of weight loss medication. Denies use of O2.   Emmi instructions given for colonoscopy.  Pre-Visit was conducted by phone due to Covid 19. Instructions were reviewed with patient and mailed to patients confirmed home address. Patient was encouraged to call if she had any questions regarding instructions.  

## 2018-08-21 ENCOUNTER — Encounter: Payer: Medicare Other | Admitting: Gastroenterology

## 2018-09-01 ENCOUNTER — Telehealth: Payer: Self-pay | Admitting: Gastroenterology

## 2018-09-01 NOTE — Telephone Encounter (Signed)
Hi Dr. Lyndel Safe, this patient just cancelled her procedure that was scheduled for tomorrow with you because her mother got sick unexpectedly. She is rescheduled to 8/11. Thank you.

## 2018-09-02 ENCOUNTER — Encounter: Payer: Medicare Other | Admitting: Gastroenterology

## 2018-09-02 NOTE — Telephone Encounter (Signed)
Thanks for letting me know RG 

## 2018-09-08 ENCOUNTER — Telehealth: Payer: Self-pay | Admitting: Gastroenterology

## 2018-09-08 NOTE — Telephone Encounter (Signed)
Pt is scheduled for a colon tomorrow and requested a call to discuss her diet.  Pt reported that she is diabetic and is concerned.

## 2018-09-08 NOTE — Telephone Encounter (Signed)

## 2018-09-08 NOTE — Telephone Encounter (Signed)
Returned patients call to discuss diet and review instructions for colonoscopy. Patient states that she is nervous about not eating solid foods today because she was afraid that her glucose would drop too low.  I instructed the patient to have something that does have some natural sugar a few times a day. Also it would be good to have some apple juice at 5:15 fifteen minutes before she has to stop having having liquids. Patient was re-assured that we would check her glucose when she gets here and we could make adjustments if her glucose was too low. Patient said that she was re-assured.   Riki Sheer, LPN ( Admitting )

## 2018-09-09 ENCOUNTER — Ambulatory Visit (AMBULATORY_SURGERY_CENTER): Payer: Medicare Other | Admitting: Gastroenterology

## 2018-09-09 ENCOUNTER — Encounter: Payer: Self-pay | Admitting: Gastroenterology

## 2018-09-09 ENCOUNTER — Other Ambulatory Visit: Payer: Self-pay

## 2018-09-09 VITALS — BP 130/57 | HR 79 | Temp 97.4°F | Resp 23 | Ht 68.0 in | Wt 214.0 lb

## 2018-09-09 DIAGNOSIS — Z86010 Personal history of colonic polyps: Secondary | ICD-10-CM

## 2018-09-09 DIAGNOSIS — Z1211 Encounter for screening for malignant neoplasm of colon: Secondary | ICD-10-CM

## 2018-09-09 MED ORDER — SODIUM CHLORIDE 0.9 % IV SOLN: 500.0000 mL | Freq: Once | INTRAVENOUS | Status: DC

## 2018-09-09 NOTE — Op Note (Signed)
[facility] Patient Name: Briana Scott Procedure Date: 09/09/2018 8:31 AM MRN: [id number] Endoscopist: Lynann Bolognaajesh Cahterine Heinzel , MD Age: [age] Referring MD:  Date of Birth: [birth year] Gender: Female Account #: [id number] Procedure:                Colonoscopy Indications:              High risk colon cancer surveillance: Personal                            history of colonic polyps Medicines:                Monitored Anesthesia Care Procedure:                Pre-Anesthesia Assessment:                           - Prior to the procedure, a History and Physical                            was performed, and patient medications and                            allergies were reviewed. The patient's tolerance of                            previous anesthesia was also reviewed. The risks                            and benefits of the procedure and the sedation                            options and risks were discussed with the patient.                            All questions were answered, and informed consent                            was obtained. Prior Anticoagulants: The patient has                            taken no previous anticoagulant or antiplatelet                            agents. ASA Grade Assessment: III - A patient with                            severe systemic disease. After reviewing the risks                            and benefits, the patient was deemed in                            satisfactory condition to undergo the procedure.  After obtaining informed consent, the colonoscope                            was passed under direct vision. Throughout the                            procedure, the patient's blood pressure, pulse, and                            oxygen saturations were monitored continuously. The                            Colonoscope was introduced through the anus and                            advanced to the the cecum, identified  by                            appendiceal orifice and ileocecal valve. The                            colonoscopy was somewhat difficult due to a                            tortuous colon. Successful completion of the                            procedure was aided by applying abdominal pressure.                            The patient tolerated the procedure well. The                            quality of the bowel preparation was good except in                            the right colon where there was adherent stool                            which could not be fully washed especially in the                            cecum. The ileocecal valve, appendiceal orifice,                            and rectum were photographed. Scope In: 8:43:06 AM Scope Out: 9:02:20 AM Scope Withdrawal Time: 0 hours 10 minutes 38 seconds  Total Procedure Duration: 0 hours 19 minutes 14 seconds  Findings:                 There were 3 small lipomas, 8 to 10 mm in diameter,                            in the  ascending colon.                           Multiple small-mouthed diverticula were found in                            the sigmoid colon.                           Non-bleeding internal hemorrhoids were found during                            retroflexion. The hemorrhoids were small.                           The exam was otherwise without abnormality. Complications:            No immediate complications. Estimated Blood Loss:     Estimated blood loss: none. Impression:               -Moderate sigmoid diverticulosis.                           -Incidental lipomas of the ascending colon.                           -Internal hemorrhoids. Recommendation:           - Patient has a contact number available for                            emergencies. The signs and symptoms of potential                            delayed complications were discussed with the                            patient. Return to normal  activities tomorrow.                            Written discharge instructions were provided to the                            patient.                           - Resume previous diet.                           - Continue present medications.                           - Repeat colonoscopy in 10 years for screening                            purposes. Earlier, if with any new problems.                           -  Return to GI clinic PRN. Jackquline Denmark, MD 09/09/2018 9:11:08 AM This report has been signed electronically.

## 2018-09-09 NOTE — Patient Instructions (Signed)
Information on diverticulosis and hemorrhoids given to you today.  YOU HAD AN ENDOSCOPIC PROCEDURE TODAY AT THE [facility]:   Refer to the procedure report that was given to you for any specific questions about what was found during the examination.  If the procedure report does not answer your questions, please call your gastroenterologist to clarify.  If you requested that your care partner not be given the details of your procedure findings, then the procedure report has been included in a sealed envelope for you to review at your convenience later.  YOU SHOULD EXPECT: Some feelings of bloating in the abdomen. Passage of more gas than usual.  Walking can help get rid of the air that was put into your GI tract during the procedure and reduce the bloating. If you had a lower endoscopy (such as a colonoscopy or flexible sigmoidoscopy) you may notice spotting of blood in your stool or on the toilet paper. If you underwent a bowel prep for your procedure, you may not have a normal bowel movement for a few days.  Please Note:  You might notice some irritation and congestion in your nose or some drainage.  This is from the oxygen used during your procedure.  There is no need for concern and it should clear up in a day or so.  SYMPTOMS TO REPORT IMMEDIATELY:   Following lower endoscopy (colonoscopy or flexible sigmoidoscopy):  Excessive amounts of blood in the stool  Significant tenderness or worsening of abdominal pains  Swelling of the abdomen that is new, acute  Fever of 100F or higher For urgent or emergent issues, a gastroenterologist can be reached at any hour by calling [phone].   DIET:  We do recommend a small meal at first, but then you may proceed to your regular diet.  Drink plenty of fluids but you should avoid alcoholic beverages for 24 hours.  ACTIVITY:  You should plan to take it easy for the rest of today and you should NOT DRIVE or use heavy machinery until  tomorrow (because of the sedation medicines used during the test).    FOLLOW UP: Our staff will call the number listed on your records 48-72 hours following your procedure to check on you and address any questions or concerns that you may have regarding the information given to you following your procedure. If we do not reach you, we will leave a message.  We will attempt to reach you two times.  During this call, we will ask if you have developed any symptoms of COVID 19. If you develop any symptoms (ie: fever, flu-like symptoms, shortness of breath, cough etc.) before then, please call [phone].  If you test positive for Covid 19 in the 2 weeks post procedure, please call and report this information to us.    If any biopsies were taken you will be contacted by phone or by letter within the next 1-3 weeks.  Please call us at [phone] if you have not heard about the biopsies in 3 weeks.    SIGNATURES/CONFIDENTIALITY: You and/or your care partner have signed paperwork which will be entered into your electronic medical record.  These signatures attest to the fact that that the information above on your After Visit Summary has been reviewed and is understood.  Full responsibility of the confidentiality of this discharge information lies with you and/or your care-partner. 

## 2018-09-09 NOTE — Progress Notes (Signed)
Report given to PACU, vss 

## 2018-09-09 NOTE — Progress Notes (Signed)
Pt's states no medical or surgical changes since previsit or office visit. 

## 2018-09-11 ENCOUNTER — Telehealth: Payer: Self-pay | Admitting: *Deleted

## 2018-09-11 NOTE — Telephone Encounter (Signed)
  Follow up Call-  Call back number 09/09/2018  Post procedure Call Back phone  # [phone]  Permission to leave phone message Yes  Some recent data might be hidden     Patient questions:  Do you have a fever, pain , or abdominal swelling? No. Pain Score  0 *  Have you tolerated food without any problems? Yes.    Have you been able to return to your normal activities? Yes.    Do you have any questions about your discharge instructions: Diet   No. Medications  No. Follow up visit  No.  Do you have questions or concerns about your Care? No.  Actions: * If pain score is 4 or above: No action needed, pain <4.  1. Have you developed a fever since your procedure? no  2.   Have you had an respiratory symptoms (SOB or cough) since your procedure? no 3.   Have you tested positive for COVID 19 since your procedure no  4.   Have you had any family members/close contacts diagnosed with the COVID 19 since your procedure?  no   If yes to any of these questions please route to Joylene John, RN and Alphonsa Gin, Therapist, sports.

## 2020-05-07 ENCOUNTER — Emergency Department
Admission: EM | Admit: 2020-05-07 | Discharge: 2020-05-07 | Disposition: A | Discharge status: Home / Self Care | Payer: No Typology Code available for payment source | Attending: Emergency Medicine | Admitting: Emergency Medicine

## 2020-05-07 ENCOUNTER — Other Ambulatory Visit: Payer: Self-pay

## 2020-05-07 ENCOUNTER — Emergency Department: Payer: No Typology Code available for payment source

## 2020-05-07 ENCOUNTER — Encounter: Payer: Self-pay | Admitting: Emergency Medicine

## 2020-05-07 DIAGNOSIS — M25561 Pain in right knee: Secondary | ICD-10-CM | POA: Diagnosis present

## 2020-05-07 DIAGNOSIS — Z794 Long term (current) use of insulin: Secondary | ICD-10-CM | POA: Diagnosis not present

## 2020-05-07 DIAGNOSIS — Z7982 Long term (current) use of aspirin: Secondary | ICD-10-CM | POA: Diagnosis not present

## 2020-05-07 DIAGNOSIS — E119 Type 2 diabetes mellitus without complications: Secondary | ICD-10-CM | POA: Insufficient documentation

## 2020-05-07 DIAGNOSIS — W19XXXA Unspecified fall, initial encounter: Secondary | ICD-10-CM | POA: Diagnosis not present

## 2020-05-07 DIAGNOSIS — M25562 Pain in left knee: Secondary | ICD-10-CM | POA: Diagnosis not present

## 2020-05-07 DIAGNOSIS — I10 Essential (primary) hypertension: Secondary | ICD-10-CM | POA: Insufficient documentation

## 2020-05-07 DIAGNOSIS — E039 Hypothyroidism, unspecified: Secondary | ICD-10-CM | POA: Diagnosis not present

## 2020-05-07 DIAGNOSIS — Z79899 Other long term (current) drug therapy: Secondary | ICD-10-CM | POA: Diagnosis not present

## 2020-05-07 MED ORDER — ORPHENADRINE CITRATE 30 MG/ML IJ SOLN
60.0000 mg | Freq: Two times a day (BID) | INTRAMUSCULAR | Status: DC
  Administered 2020-05-07: 60 mg via INTRAMUSCULAR
  Filled 2020-05-07: qty 2

## 2020-05-07 MED ORDER — HYDROMORPHONE HCL 1 MG/ML IJ SOLN: 1.0000 mg | Freq: Once | INTRAMUSCULAR | Status: DC

## 2020-05-07 MED ORDER — LIDOCAINE 5 % EX PTCH
2.0000 | MEDICATED_PATCH | CUTANEOUS | Status: DC
  Administered 2020-05-07: 2 via TRANSDERMAL
  Filled 2020-05-07: qty 2

## 2020-05-07 NOTE — Discharge Instructions (Addendum)
Moderate to severe degenerative changes from the bilateral knee.  No other acute findings.  Follow-up with treating orthopedics at the [place].  Continue previous medications.

## 2020-05-07 NOTE — ED Provider Notes (Signed)
[facility] Emergency Department Provider Note   ____________________________________________   Event Date/Time   First MD Initiated Contact with Patient 05/07/20 1431     (approximate)  I have reviewed the triage vital signs and the nursing notes.   HISTORY  Chief Complaint Fall    HPI Briana Scott is a 70 y.o. female patient complain of bilateral knee pain secondary to mechanical fall yesterday.  Patient denies LOC or head injury.  Patient has degenerative changes bilaterally and is pending knee replacement after weight loss.  Patient is not taking blood thinners.  Rates pain as a 9/10.  Described pain as "achy".  Patient ambulates with assistance of a cane.  No palliative measure prior to arrival.         Past Medical History:  Diagnosis Date  . Allergy   . Anxiety   . Arthritis   . Diabetes mellitus   . GERD (gastroesophageal reflux disease)   . Hyperlipidemia   . Hypertension   . Mitral valve prolapse   . Osteoporosis   . Thyroid disease     Patient Active Problem List   Diagnosis Date Noted  . DIZZINESS 08/16/2009  . CHEST PAIN 08/16/2009  . HYPOTHYROIDISM 08/12/2009  . DM 08/12/2009  . HYPERTENSION 08/12/2009  . MITRAL VALVE PROLAPSE 08/12/2009    Past Surgical History:  Procedure Laterality Date  . ABDOMINAL HYSTERECTOMY    . HERNIA REPAIR    . KNEE SURGERY    . TONSILLECTOMY      Prior to Admission medications   Medication Sig Start Date End Date Taking? Authorizing Provider  aspirin EC 81 MG tablet Take 81 mg by mouth daily.    [provider]  atorvastatin (LIPITOR) 40 MG tablet Take 20 mg by mouth at bedtime.    [provider]  Cholecalciferol (VITAMIN D) 2000 units CAPS Take 1 capsule by mouth 2 (two) times daily.    [provider]  diclofenac sodium (VOLTAREN) 1 % GEL Apply 2 g topically 4 (four) times daily. 03/19/16   Evon Slack, PA-C  furosemide (LASIX) 40 MG tablet Take 40  mg by mouth daily.    [provider]  HYDROcodone-acetaminophen (NORCO/VICODIN) 5-325 MG per tablet Take 1-2 tablets by mouth every 6 (six) hours as needed for pain. Patient taking differently: Take 1-2 tablets by mouth 3 (three) times daily.  03/16/12   Cathren Laine, MD  insulin aspart protamine-insulin aspart (NOVOLOG 70/30) (70-30) 100 UNIT/ML injection Inject 30-60 Units into the skin See admin instructions. Takes 60 units in the am and 30 units in the pm    [provider]  levothyroxine (SYNTHROID, LEVOTHROID) 75 MCG tablet Take 75 mcg by mouth daily.      [provider]  lisinopril (PRINIVIL,ZESTRIL) 40 MG tablet Take 20 mg by mouth 2 (two) times daily.    [provider]  magnesium oxide (MAG-OX) 400 MG tablet Take 400 mg by mouth daily.    [provider]  Omega-3 Fatty Acids (FISH OIL) 1000 MG CAPS Take 2 capsules by mouth 2 (two) times daily.     [provider]  OVER THE COUNTER MEDICATION Melatonin, 3 mg one tablet at bedtime.    [provider]  Polyethyl Glycol-Propyl Glycol (SYSTANE) 0.4-0.3 % SOLN Place 1 drop into both eyes daily.    [provider]  PRESCRIPTION MEDICATION Place 1 spray into both nostrils at bedtime. Nasal spray    [provider]  Probiotic Product (PROBIOTIC PO) Take 2 capsules by mouth 2 (two) times daily.    [provider]  verapamil (CALAN-SR) 180 MG CR tablet Take 180 mg by mouth 2 (two) times daily.     [provider]    Allergies Hydroxyzine pamoate, Motrin [ibuprofen], Oxycodone-acetaminophen, Prochlorperazine edisylate, Chlorpromazine hcl, Codeine, and Propoxyphene n-acetaminophen  Family History  Problem Relation Age of Onset  . Colon cancer Neg Hx   . Esophageal cancer Neg Hx   . Rectal cancer Neg Hx   . Stomach cancer Neg Hx     Social History Social History   Tobacco Use  . Smoking status: Never Smoker  . Smokeless tobacco: Never Used   Substance Use Topics  . Alcohol use: No  . Drug use: No    Review of Systems Constitutional: No fever/chills Eyes: No visual changes. ENT: No sore throat. Cardiovascular: Denies chest pain. Respiratory: Denies shortness of breath. Gastrointestinal: No abdominal pain.  No nausea, no vomiting.  No diarrhea.  No constipation. Genitourinary: Negative for dysuria. Musculoskeletal: Bilateral knee pain. Skin: Negative for rash. Neurological: Negative for headaches, focal weakness or numbness. Psychiatric:  Anxiety. Endocrine:  Diabetes, hyperlipidemia, hypertension, hypothyroidism. Allergic/Immunilogical: See allergy medication list.  ____________________________________________   PHYSICAL EXAM:  VITAL SIGNS: ED Triage Vitals  Enc Vitals Group     BP 05/07/20 1412 (!) 174/83     Pulse Rate 05/07/20 1409 88     Resp 05/07/20 1409 20     Temp 05/07/20 1409 98 F (36.7 C)     Temp Source 05/07/20 1409 Oral     SpO2 05/07/20 1409 96 %     Weight 05/07/20 1410 226 lb (102.5 kg)     Height 05/07/20 1410 5\' 8"  (1.727 m)     Head Circumference --      Peak Flow --      Pain Score 05/07/20 1410 9     Pain Loc --      Pain Edu? --      Excl. in GC? --     Constitutional: Alert and oriented.  Moderate distress.  BMI is 34.36.   Cardiovascular: Normal rate, regular rhythm. Grossly normal heart sounds.  Good peripheral circulation.  Elevated blood pressure. Respiratory: Normal respiratory effort.  No retractions. Lungs CTAB. Gastrointestinal: Soft and nontender. No distention. No abdominal bruits. No CVA tenderness. Genitourinary: Deferred Musculoskeletal: No lower extremity tenderness nor edema.  No joint effusions. Neurologic:  Normal speech and language. No gross focal neurologic deficits are appreciated. No gait instability. Skin:  Skin is warm, dry and intact. No rash noted. Psychiatric: Mood and affect are normal. Speech and behavior are  normal.  ____________________________________________   LABS (all labs ordered are listed, but only abnormal results are displayed)  Labs Reviewed - No data to display ____________________________________________  EKG   ____________________________________________  RADIOLOGY I, 07/07/20, personally viewed and evaluated these images (plain radiographs) as part of my medical decision making, as well as reviewing the written report by the radiologist.  ED MD interpretation: Patient has moderate to severe degenerative changes in the bilateral knee.  Official radiology report(s): DG Knee 2 Views Left  Result Date: 05/07/2020 CLINICAL DATA:  Pain following fall EXAM: LEFT KNEE - 1-2 VIEW COMPARISON:  May 12, 2017 FINDINGS: Frontal and lateral views were obtained. No fracture or dislocation. No appreciable joint effusion. There is marked narrowing medially and in the patellofemoral joint region. There is spurring in all compartments, similar to prior study.  No erosion. 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 Spurring noted in all compartments, stable. No fracture, dislocation, or joint effusion. Electronically Signed   By: Bretta Bang III M.D.   On: 05/07/2020 15:34   DG Knee 2 Views Right  Result Date: 05/07/2020 CLINICAL DATA:  Pain pain following fall EXAM: RIGHT KNEE - 1-2 VIEW COMPARISON:  None. FINDINGS: Frontal and lateral views were obtained. No fracture or dislocation. No joint effusion. There is moderately severe joint space narrowing in the patellofemoral joint region. There is also moderate narrowing medially. There is spurring in all compartments, most severe in the patellofemoral joint. No erosion. IMPRESSION: Osteoarthritic change, most severe in the patellofemoral joint and medial compartments. No fracture, dislocation, or joint effusion. Electronically Signed   By: Bretta Bang III M.D.   On: 05/07/2020 15:33     ____________________________________________   PROCEDURES  Procedure(s) performed (including Critical Care):  Procedures   ____________________________________________   INITIAL IMPRESSION / ASSESSMENT AND PLAN / ED COURSE  As part of my medical decision making, I reviewed the following data within the electronic MEDICAL RECORD NUMBER         Patient presents with bilateral knee pain status post fall yesterday.  Discussed x-ray findings with patient showing moderate to severe degenerative changes of bilateral knee.  No other acute findings.  Patient advised continue previous pain medications and follow-up with treating orthopedics at the [facility].      ____________________________________________   FINAL CLINICAL IMPRESSION(S) / ED DIAGNOSES  Final diagnoses:  Acute pain of both knees  Fall, initial encounter     ED Discharge Orders    None      *Please note:  Lener L Mccomber was evaluated in Emergency Department on 05/07/2020 for the symptoms described in the history of present illness. She was evaluated in the context of the global COVID-19 pandemic, which necessitated consideration that the patient might be at risk for infection with the SARS-CoV-2 virus that causes COVID-19. Institutional protocols and algorithms that pertain to the evaluation of patients at risk for COVID-19 are in a state of rapid change based on information released by regulatory bodies including the CDC and federal and state organizations. These policies and algorithms were followed during the patient's care in the ED.  Some ED evaluations and interventions may be delayed as a result of limited staffing during and the pandemic.*   Note:  This document was prepared using Dragon voice recognition software and may include unintentional dictation errors.    Joni Reining, PA-C 05/07/20 1555    Jene Every, MD 05/08/20 [phone]

## 2020-05-07 NOTE — ED Triage Notes (Signed)
Pt via POV from home. Pt had a mechanical fall yesterday. Denies blood thinners. Denies LOC/head injury. Pt states she is having bilateral knee pain but the L hurts more. Pt states she does notices some swelling. Pt is A&Ox4 and NAD.

## 2020-05-07 NOTE — ED Notes (Signed)
Pt tripped yesterday over some hay and fell on her right side. C/o right knee pain with swelling present.

## 2020-06-04 ENCOUNTER — Other Ambulatory Visit: Payer: Self-pay

## 2020-06-04 ENCOUNTER — Emergency Department
Admission: EM | Admit: 2020-06-04 | Discharge: 2020-06-04 | Disposition: A | Discharge status: Home / Self Care | Payer: No Typology Code available for payment source | Attending: Emergency Medicine | Admitting: Emergency Medicine

## 2020-06-04 DIAGNOSIS — L270 Generalized skin eruption due to drugs and medicaments taken internally: Secondary | ICD-10-CM | POA: Insufficient documentation

## 2020-06-04 DIAGNOSIS — Z79899 Other long term (current) drug therapy: Secondary | ICD-10-CM | POA: Insufficient documentation

## 2020-06-04 DIAGNOSIS — I10 Essential (primary) hypertension: Secondary | ICD-10-CM | POA: Insufficient documentation

## 2020-06-04 DIAGNOSIS — E1169 Type 2 diabetes mellitus with other specified complication: Secondary | ICD-10-CM | POA: Diagnosis not present

## 2020-06-04 DIAGNOSIS — E039 Hypothyroidism, unspecified: Secondary | ICD-10-CM | POA: Diagnosis not present

## 2020-06-04 DIAGNOSIS — Z7982 Long term (current) use of aspirin: Secondary | ICD-10-CM | POA: Diagnosis not present

## 2020-06-04 DIAGNOSIS — E785 Hyperlipidemia, unspecified: Secondary | ICD-10-CM | POA: Diagnosis not present

## 2020-06-04 DIAGNOSIS — Z794 Long term (current) use of insulin: Secondary | ICD-10-CM | POA: Insufficient documentation

## 2020-06-04 DIAGNOSIS — R21 Rash and other nonspecific skin eruption: Secondary | ICD-10-CM | POA: Diagnosis present

## 2020-06-04 LAB — CBG MONITORING, ED: Glucose-Capillary: 136 mg/dL — ABNORMAL HIGH (ref 70–99)

## 2020-06-04 MED ORDER — METHYLPREDNISOLONE 4 MG PO TBPK: ORAL_TABLET | ORAL | 0 refills | Status: AC

## 2020-06-04 MED ORDER — DESOXIMETASONE 0.25 % EX CREA: 1.0000 "application " | TOPICAL_CREAM | Freq: Two times a day (BID) | CUTANEOUS | 0 refills | Status: AC

## 2020-06-04 MED ORDER — DEXAMETHASONE SODIUM PHOSPHATE 10 MG/ML IJ SOLN
10.0000 mg | Freq: Once | INTRAMUSCULAR | Status: AC
  Administered 2020-06-04: 10 mg via INTRAMUSCULAR
  Filled 2020-06-04: qty 1

## 2020-06-04 NOTE — Discharge Instructions (Addendum)
Read and follow discharge care instructions.  Take medication as directed.  Return back to ED if blood sugar increased to 400s while taking steroids.

## 2020-06-04 NOTE — ED Provider Notes (Signed)
[facility] Emergency Department Provider Note   ____________________________________________   Event Date/Time   First MD Initiated Contact with Patient 06/04/20 1442     (approximate)  I have reviewed the triage vital signs and the nursing notes.   HISTORY  Chief Complaint Rash    HPI Briana Scott is a 70 y.o. female patient presents with diffuse rash status post starting new blood pressure medication (carvedilol).  Patient noted for her PCP and told to discontinue medication and new prescription was sent for verapamil without the red dye.  It was found in the past that the red dye and verapamil was causing her rash.  Patient blood pressure is controlled but she has concerns secondary to the rash.  Last doses of the medication was taken 2 days ago.  Patient stated rash has not progressed but but is still present.  Patient states her anxiety is also increasing the scratching episodes.         Past Medical History:  Diagnosis Date  . Allergy   . Anxiety   . Arthritis   . Diabetes mellitus   . GERD (gastroesophageal reflux disease)   . Hyperlipidemia   . Hypertension   . Mitral valve prolapse   . Osteoporosis   . Thyroid disease     Patient Active Problem List   Diagnosis Date Noted  . DIZZINESS 08/16/2009  . CHEST PAIN 08/16/2009  . HYPOTHYROIDISM 08/12/2009  . DM 08/12/2009  . HYPERTENSION 08/12/2009  . MITRAL VALVE PROLAPSE 08/12/2009    Past Surgical History:  Procedure Laterality Date  . ABDOMINAL HYSTERECTOMY    . HERNIA REPAIR    . KNEE SURGERY    . TONSILLECTOMY      Prior to Admission medications   Medication Sig Start Date End Date Taking? Authorizing Provider  desoximetasone (TOPICORT) 0.25 % cream Apply 1 application topically 2 (two) times daily. 06/04/20  Yes Joni Reining, PA-C  methylPREDNISolone (MEDROL DOSEPAK) 4 MG TBPK tablet Take Tapered dose as directed 06/04/20  Yes Joni Reining, PA-C  aspirin EC 81 MG  tablet Take 81 mg by mouth daily.    [provider]  atorvastatin (LIPITOR) 40 MG tablet Take 20 mg by mouth at bedtime.    [provider]  Cholecalciferol (VITAMIN D) 2000 units CAPS Take 1 capsule by mouth 2 (two) times daily.    [provider]  diclofenac sodium (VOLTAREN) 1 % GEL Apply 2 g topically 4 (four) times daily. 03/19/16   Evon Slack, PA-C  furosemide (LASIX) 40 MG tablet Take 40 mg by mouth daily.    [provider]  HYDROcodone-acetaminophen (NORCO/VICODIN) 5-325 MG per tablet Take 1-2 tablets by mouth every 6 (six) hours as needed for pain. Patient taking differently: Take 1-2 tablets by mouth 3 (three) times daily.  03/16/12   Cathren Laine, MD  insulin aspart protamine-insulin aspart (NOVOLOG 70/30) (70-30) 100 UNIT/ML injection Inject 30-60 Units into the skin See admin instructions. Takes 60 units in the am and 30 units in the pm    [provider]  levothyroxine (SYNTHROID, LEVOTHROID) 75 MCG tablet Take 75 mcg by mouth daily.      [provider]  lisinopril (PRINIVIL,ZESTRIL) 40 MG tablet Take 20 mg by mouth 2 (two) times daily.    [provider]  magnesium oxide (MAG-OX) 400 MG tablet Take 400 mg by mouth daily.    [provider]  Omega-3 Fatty Acids (FISH OIL) 1000 MG  CAPS Take 2 capsules by mouth 2 (two) times daily.     [provider]  OVER THE COUNTER MEDICATION Melatonin, 3 mg one tablet at bedtime.    [provider]  Polyethyl Glycol-Propyl Glycol (SYSTANE) 0.4-0.3 % SOLN Place 1 drop into both eyes daily.    [provider]  PRESCRIPTION MEDICATION Place 1 spray into both nostrils at bedtime. Nasal spray    [provider]  Probiotic Product (PROBIOTIC PO) Take 2 capsules by mouth 2 (two) times daily.    [provider]  verapamil (CALAN-SR) 180 MG CR tablet Take 180 mg by mouth 2 (two) times daily.     [provider]     Allergies Hydroxyzine pamoate, Motrin [ibuprofen], Oxycodone-acetaminophen, Prochlorperazine edisylate, Chlorpromazine hcl, Codeine, and Propoxyphene n-acetaminophen  Family History  Problem Relation Age of Onset  . Colon cancer Neg Hx   . Esophageal cancer Neg Hx   . Rectal cancer Neg Hx   . Stomach cancer Neg Hx     Social History Social History   Tobacco Use  . Smoking status: Never Smoker  . Smokeless tobacco: Never Used  Substance Use Topics  . Alcohol use: No  . Drug use: No    Review of Systems  Constitutional: No fever/chills Eyes: No visual changes. ENT: No sore throat. Cardiovascular: Denies chest pain. Respiratory: Denies shortness of breath. Gastrointestinal: No abdominal pain.  No nausea, no vomiting.  No diarrhea.  No constipation. Genitourinary: Negative for dysuria. Musculoskeletal: Negative for back pain. Skin: Positive for rash. Neurological: Negative for headaches, focal weakness or numbness. Psychiatric:  Anxiety. Endocrine:  Diabetes, hyperlipidemia, hypertension. Allergic/Immunilogical: See extensive drug allergy list. ____________________________________________   PHYSICAL EXAM:  VITAL SIGNS: ED Triage Vitals  Enc Vitals Group     BP 06/04/20 1354 (!) 146/92     Pulse Rate 06/04/20 1354 85     Resp 06/04/20 1354 18     Temp 06/04/20 1354 98.2 F (36.8 C)     Temp Source 06/04/20 1354 Oral     SpO2 06/04/20 1354 97 %     Weight 06/04/20 1351 224 lb (101.6 kg)     Height 06/04/20 1351 5\' 8"  (1.727 m)     Head Circumference --      Peak Flow --      Pain Score 06/04/20 1350 0     Pain Loc --      Pain Edu? --      Excl. in GC? --    Constitutional: Alert and oriented. Well appearing and in no acute distress. Eyes: Conjunctivae are normal. PERRL. EOMI. Head: Atraumatic. Nose: No congestion/rhinnorhea. Mouth/Throat: Mucous membranes are moist.  Oropharynx non-erythematous. Neck: No stridor.    Hematological/Lymphatic/Immunilogical: No cervical lymphadenopathy. Cardiovascular: Normal rate, regular rhythm. Grossly normal heart sounds.  Good peripheral circulation. Respiratory: Normal respiratory effort.  No retractions. Lungs CTAB. Gastrointestinal: Soft and nontender. No distention. No abdominal bruits. No CVA tenderness. Genitourinary: Deferred Musculoskeletal: No lower extremity tenderness nor edema.  No joint effusions. Neurologic:  Normal speech and language. No gross focal neurologic deficits are appreciated. No gait instability. Skin:  Skin is warm, dry and intact.  Diffuse erythematous macular lesions psychiatric: Mood and affect are normal. Speech and behavior are normal.  ____________________________________________   LABS (all labs ordered are listed, but only abnormal results are displayed)  Labs Reviewed  CBG MONITORING, ED - Abnormal; Notable for the following components:      Result Value   Glucose-Capillary 136 (*)  All other components within normal limits   ____________________________________________  EKG   ____________________________________________  RADIOLOGY I, Joni Reining, personally viewed and evaluated these images (plain radiographs) as part of my medical decision making, as well as reviewing the written report by the radiologist.  ED MD interpretation:    Official radiology report(s): No results found.  ____________________________________________   PROCEDURES  Procedure(s) performed (including Critical Care):  Procedures   ____________________________________________   INITIAL IMPRESSION / ASSESSMENT AND PLAN / ED COURSE  As part of my medical decision making, I reviewed the following data within the electronic MEDICAL RECORD NUMBER         Patient presents for rash status post starting new blood pressure medication.  Medication has been changed by PCP 2 days ago.  Patient had a blood sugar 136 and discussed taking 10 mg of  Decadron IM.  Patient will follow with prescription for Medrol Dosepak and Westcort to apply to lesions.  Patient will follow up with PCP in 2 days.      ____________________________________________   FINAL CLINICAL IMPRESSION(S) / ED DIAGNOSES  Final diagnoses:  Drug rash     ED Discharge Orders         Ordered    desoximetasone (TOPICORT) 0.25 % cream  2 times daily        06/04/20 1532    methylPREDNISolone (MEDROL DOSEPAK) 4 MG TBPK tablet        06/04/20 1541          *Please note:  Josslin L Allbright was evaluated in Emergency Department on 06/04/2020 for the symptoms described in the history of present illness. She was evaluated in the context of the global COVID-19 pandemic, which necessitated consideration that the patient might be at risk for infection with the SARS-CoV-2 virus that causes COVID-19. Institutional protocols and algorithms that pertain to the evaluation of patients at risk for COVID-19 are in a state of rapid change based on information released by regulatory bodies including the CDC and federal and state organizations. These policies and algorithms were followed during the patient's care in the ED.  Some ED evaluations and interventions may be delayed as a result of limited staffing during and the pandemic.*   Note:  This document was prepared using Dragon voice recognition software and may include unintentional dictation errors.    Joni Reining, PA-C 06/04/20 1542    Sharyn Creamer, MD 06/04/20 [phone]

## 2020-06-04 NOTE — ED Triage Notes (Signed)
Pt states that she was started on a new medication for her bp (carvedilol) and started having a rash on her neck, arms, and legs- pt states that she was seen at the [facility] for it and was given a hydrocortisone cream for it but states it has not helped

## 2023-10-05 ENCOUNTER — Emergency Department (HOSPITAL_BASED_OUTPATIENT_CLINIC_OR_DEPARTMENT_OTHER)
Admission: EM | Admit: 2023-10-05 | Discharge: 2023-10-05 | Disposition: A | Discharge status: Home / Self Care | Source: Ambulatory Visit | Attending: Emergency Medicine | Admitting: Emergency Medicine

## 2023-10-05 ENCOUNTER — Other Ambulatory Visit: Payer: Self-pay

## 2023-10-05 DIAGNOSIS — E119 Type 2 diabetes mellitus without complications: Secondary | ICD-10-CM | POA: Insufficient documentation

## 2023-10-05 DIAGNOSIS — Z7982 Long term (current) use of aspirin: Secondary | ICD-10-CM | POA: Insufficient documentation

## 2023-10-05 DIAGNOSIS — R0981 Nasal congestion: Secondary | ICD-10-CM | POA: Diagnosis present

## 2023-10-05 DIAGNOSIS — I10 Essential (primary) hypertension: Secondary | ICD-10-CM | POA: Insufficient documentation

## 2023-10-05 DIAGNOSIS — U071 COVID-19: Secondary | ICD-10-CM | POA: Insufficient documentation

## 2023-10-05 DIAGNOSIS — Z79899 Other long term (current) drug therapy: Secondary | ICD-10-CM | POA: Insufficient documentation

## 2023-10-05 DIAGNOSIS — Z794 Long term (current) use of insulin: Secondary | ICD-10-CM | POA: Insufficient documentation

## 2023-10-05 LAB — RESP PANEL BY RT-PCR (RSV, FLU A&B, COVID)  RVPGX2
Influenza A by PCR: NEGATIVE
Influenza B by PCR: NEGATIVE
Resp Syncytial Virus by PCR: NEGATIVE
SARS Coronavirus 2 by RT PCR: POSITIVE — AB

## 2023-10-05 NOTE — Discharge Instructions (Signed)
 You have tested positive for COVID infection.  Please return if you develop significant shortness of breath, persistent vomiting or if you have other concern.

## 2023-10-05 NOTE — ED Triage Notes (Signed)
 Patient states her family member tested positive for covid. Patient wishes to be tested for covid as well

## 2023-10-05 NOTE — ED Provider Notes (Signed)
 [place] EMERGENCY DEPARTMENT AT [facility] Provider Note   CSN: [id number] Arrival date & time: 10/05/23  1613     Patient presents with: Covid Exposure   Briana Scott is a 73 y.o. female.   The history is provided by the patient and medical records. No language interpreter was used.     73 year old female with history of diabetes, hypertension, GERD presenting with concerns of COVID.  Patient states that 5 days ago her daughter came home with flulike symptoms.  And subsequently for the next several days 2 of her kids also had similar symptoms.  Patient states she has history of allergies and always has some sinus congestion but otherwise she does not feel bad.  She denies having fever worsening shortness of breath headache sore throat vomiting or diarrhea.  She wants to be tested for COVID as her daughter recently tested positive for COVID.  Prior to Admission medications   Medication Sig Start Date End Date Taking? Authorizing Provider  aspirin  EC 81 MG tablet Take 81 mg by mouth daily.    [provider]  atorvastatin (LIPITOR) 40 MG tablet Take 20 mg by mouth at bedtime.    [provider]  Cholecalciferol (VITAMIN D) 2000 units CAPS Take 1 capsule by mouth 2 (two) times daily.    [provider]  desoximetasone  (TOPICORT ) 0.25 % cream Apply 1 application topically 2 (two) times daily. 06/04/20   Claudene Tanda POUR, PA-C  diclofenac  sodium (VOLTAREN ) 1 % GEL Apply 2 g topically 4 (four) times daily. 03/19/16   Charlene Debby BROCKS, PA-C  furosemide (LASIX) 40 MG tablet Take 40 mg by mouth daily.    [provider]  HYDROcodone -acetaminophen  (NORCO/VICODIN) 5-325 MG per tablet Take 1-2 tablets by mouth every 6 (six) hours as needed for pain. Patient taking differently: Take 1-2 tablets by mouth 3 (three) times daily.  03/16/12   Steinl, Kevin, MD  insulin aspart protamine-insulin aspart (NOVOLOG 70/30) (70-30) 100 UNIT/ML injection Inject 30-60  Units into the skin See admin instructions. Takes 60 units in the am and 30 units in the pm    [provider]  levothyroxine (SYNTHROID, LEVOTHROID) 75 MCG tablet Take 75 mcg by mouth daily.      [provider]  lisinopril (PRINIVIL,ZESTRIL) 40 MG tablet Take 20 mg by mouth 2 (two) times daily.    [provider]  magnesium oxide (MAG-OX) 400 MG tablet Take 400 mg by mouth daily.    [provider]  methylPREDNISolone  (MEDROL  DOSEPAK) 4 MG TBPK tablet Take Tapered dose as directed 06/04/20   Claudene Tanda POUR, PA-C  Omega-3 Fatty Acids (FISH OIL) 1000 MG CAPS Take 2 capsules by mouth 2 (two) times daily.     [provider]  OVER THE COUNTER MEDICATION Melatonin, 3 mg one tablet at bedtime.    [provider]  Polyethyl Glycol-Propyl Glycol (SYSTANE) 0.4-0.3 % SOLN Place 1 drop into both eyes daily.    [provider]  PRESCRIPTION MEDICATION Place 1 spray into both nostrils at bedtime. Nasal spray    [provider]  Probiotic Product (PROBIOTIC PO) Take 2 capsules by mouth 2 (two) times daily.    [provider]  verapamil (CALAN-SR) 180 MG CR tablet Take 180 mg by mouth 2 (two) times daily.     [provider]    Allergies: Hydroxyzine pamoate, Motrin [ibuprofen], Oxycodone-acetaminophen , Prochlorperazine edisylate, Chlorpromazine hcl, Codeine, and Propoxyphene n-acetaminophen     Review of Systems  All other systems reviewed and are negative.   Updated Vital Signs BP (!) 177/82 (BP Location: Right Arm)   Pulse 88   Temp 97.9 F (36.6 C)   Resp 20   SpO2 99%   Physical Exam Vitals and nursing note reviewed.  Constitutional:      General: She is not in acute distress.    Appearance: She is well-developed. She is obese.  HENT:     Head: Atraumatic.  Eyes:     Conjunctiva/sclera: Conjunctivae normal.  Cardiovascular:     Rate and Rhythm: Normal rate and regular rhythm.     Pulses: Normal  pulses.     Heart sounds: Normal heart sounds.  Pulmonary:     Effort: Pulmonary effort is normal.     Breath sounds: No wheezing, rhonchi or rales.  Musculoskeletal:     Cervical back: Neck supple.  Skin:    Findings: No rash.  Neurological:     Mental Status: She is alert.  Psychiatric:        Mood and Affect: Mood normal.     (all labs ordered are listed, but only abnormal results are displayed) Labs Reviewed  RESP PANEL BY RT-PCR (RSV, FLU A&B, COVID)  [id number] - Abnormal; Notable for the following components:      Result Value   SARS Coronavirus 2 by RT PCR POSITIVE (*)    All other components within normal limits    EKG: None  Radiology: No results found.   Procedures   Medications Ordered in the ED - No data to display                                  Medical Decision Making  BP (!) 177/82 (BP Location: Right Arm)   Pulse 88   Temp 97.9 F (36.6 C)   Resp 20   SpO2 99%   72:36 PM   73 year old female with history of diabetes, hypertension, GERD presenting with concerns of COVID.  Patient states that 5 days ago her daughter came home with flulike symptoms.  And subsequently for the next several days 2 of her kids also had similar symptoms.  Patient states she has history of allergies and always has some sinus congestion but otherwise she does not feel bad.  She denies having fever worsening shortness of breath headache sore throat vomiting or diarrhea.  She wants to be tested for COVID as her daughter recently tested positive for COVID.  Exam overall reassuring patient is resting comfortably in no acute discomfort.  Lungs are clear to auscultation bilaterally.  Vitals are notable for elevated blood pressure of 177/82.  Patient does have history of diabetes.  Patient otherwise without any other concerning feature.  She is not hypoxic.    Risk Prescription drug management.     HPI summary patient with recent COVID exposure.  She is without any significant  complaint.   Initial Ddx:  COVID, flu, RSV, pneumonia, viral illness   MDM/Course:  Labs obtained positive for COVID.  I discussed this finding with patient.  She appears to be upset but otherwise and not in any acute distress.  Her lungs are clear she is not hypoxic she is stable for discharge.   This patient presents to the ED for concern of complaints listed in HPI, this involves an extensive number of treatment options, and is a complaint that carries with it a high risk of complications  and morbidity. Disposition including potential need for admission considered.    Dispo: DC Home. Return precautions discussed including, but not limited to, those listed in the AVS. Allowed pt time to ask questions which were answered fully prior to dc.   Additional history obtained from notes Records reviewed  I have reviewed the patients home medications and made adjustments as needed Social Determinants of health:   At this time I recommend home quarantine, over-the-counter medication as needed for aches and pains for symptom control.  I gave patient return precaution.  She is stable for discharge.  Portions of this note were generated with Scientist, clinical (histocompatibility and immunogenetics). Dictation errors may occur despite best attempts at proofreading.       Final diagnoses:  COVID-19 virus infection    ED Discharge Orders     None          Nivia Colon, PA-C 10/05/23 1823    Yolande Lamar BROCKS, MD 10/09/23 1055
